# Patient Record
Sex: Female | Born: 1970
Health system: Southern US, Community
[De-identification: ages and names within clinical notes are randomized; demographics above are authoritative.]

## PROBLEM LIST (undated history)

## (undated) DIAGNOSIS — E039 Hypothyroidism, unspecified: Secondary | ICD-10-CM

## (undated) DIAGNOSIS — E785 Hyperlipidemia, unspecified: Secondary | ICD-10-CM

## (undated) DIAGNOSIS — O4290 Premature rupture of membranes, unspecified as to length of time between rupture and onset of labor, unspecified weeks of gestation: Secondary | ICD-10-CM

## (undated) DIAGNOSIS — D649 Anemia, unspecified: Secondary | ICD-10-CM

## (undated) HISTORY — DX: Hyperlipidemia, unspecified: E78.5

## (undated) HISTORY — PX: ESOPHAGOGASTRODUODENOSCOPY: SHX1529

## (undated) HISTORY — DX: Premature rupture of membranes, unspecified as to length of time between rupture and onset of labor, unspecified weeks of gestation: O42.90

## (undated) HISTORY — PX: COLONOSCOPY: SHX174

## (undated) HISTORY — DX: Anemia, unspecified: D64.9

## (undated) HISTORY — PX: DILATION AND CURETTAGE OF UTERUS: SHX78

## (undated) HISTORY — DX: Hypothyroidism, unspecified: E03.9

---

## 1999-01-17 ENCOUNTER — Other Ambulatory Visit: Admission: RE | Admit: 1999-01-17 | Discharge: 1999-01-17 | Payer: Self-pay | Admitting: Gynecology

## 1999-04-09 ENCOUNTER — Inpatient Hospital Stay (HOSPITAL_COMMUNITY): Admission: AD | Admit: 1999-04-09 | Discharge: 1999-04-09 | Payer: Self-pay | Admitting: Gynecology

## 1999-04-18 ENCOUNTER — Inpatient Hospital Stay (HOSPITAL_COMMUNITY): Admission: AD | Admit: 1999-04-18 | Discharge: 1999-04-18 | Payer: Self-pay | Admitting: Gynecology

## 1999-05-03 ENCOUNTER — Encounter: Payer: Self-pay | Admitting: Gynecology

## 1999-05-03 ENCOUNTER — Inpatient Hospital Stay (HOSPITAL_COMMUNITY): Admission: AD | Admit: 1999-05-03 | Discharge: 1999-05-03 | Payer: Self-pay | Admitting: Gynecology

## 2000-01-17 ENCOUNTER — Other Ambulatory Visit: Admission: RE | Admit: 2000-01-17 | Discharge: 2000-01-17 | Payer: Self-pay | Admitting: Gynecology

## 2000-07-20 ENCOUNTER — Emergency Department (HOSPITAL_COMMUNITY): Admission: EM | Admit: 2000-07-20 | Discharge: 2000-07-20 | Payer: Self-pay | Admitting: Emergency Medicine

## 2001-02-02 ENCOUNTER — Emergency Department (HOSPITAL_COMMUNITY): Admission: EM | Admit: 2001-02-02 | Discharge: 2001-02-02 | Payer: Self-pay | Admitting: Emergency Medicine

## 2001-02-12 ENCOUNTER — Other Ambulatory Visit: Admission: RE | Admit: 2001-02-12 | Discharge: 2001-02-12 | Payer: Self-pay | Admitting: Gynecology

## 2001-06-11 ENCOUNTER — Encounter: Payer: Self-pay | Admitting: Emergency Medicine

## 2001-06-11 ENCOUNTER — Emergency Department (HOSPITAL_COMMUNITY): Admission: EM | Admit: 2001-06-11 | Discharge: 2001-06-11 | Payer: Self-pay | Admitting: Emergency Medicine

## 2002-02-15 ENCOUNTER — Other Ambulatory Visit: Admission: RE | Admit: 2002-02-15 | Discharge: 2002-02-15 | Payer: Self-pay | Admitting: Gynecology

## 2003-01-10 ENCOUNTER — Emergency Department (HOSPITAL_COMMUNITY): Admission: EM | Admit: 2003-01-10 | Discharge: 2003-01-10 | Payer: Self-pay | Admitting: Emergency Medicine

## 2003-01-18 ENCOUNTER — Encounter: Admission: RE | Admit: 2003-01-18 | Discharge: 2003-01-18 | Payer: Self-pay | Admitting: Internal Medicine

## 2003-01-18 ENCOUNTER — Encounter: Payer: Self-pay | Admitting: Internal Medicine

## 2003-04-11 ENCOUNTER — Other Ambulatory Visit: Admission: RE | Admit: 2003-04-11 | Discharge: 2003-04-11 | Payer: Self-pay | Admitting: Gynecology

## 2003-12-30 ENCOUNTER — Emergency Department (HOSPITAL_COMMUNITY): Admission: EM | Admit: 2003-12-30 | Discharge: 2003-12-30 | Payer: Self-pay | Admitting: Emergency Medicine

## 2004-01-05 ENCOUNTER — Emergency Department (HOSPITAL_COMMUNITY): Admission: EM | Admit: 2004-01-05 | Discharge: 2004-01-05 | Payer: Self-pay | Admitting: Emergency Medicine

## 2004-05-01 ENCOUNTER — Other Ambulatory Visit: Admission: RE | Admit: 2004-05-01 | Discharge: 2004-05-01 | Payer: Self-pay | Admitting: Gynecology

## 2004-09-04 ENCOUNTER — Inpatient Hospital Stay (HOSPITAL_COMMUNITY): Admission: AD | Admit: 2004-09-04 | Discharge: 2004-09-04 | Payer: Self-pay | Admitting: Gynecology

## 2004-10-07 ENCOUNTER — Inpatient Hospital Stay (HOSPITAL_COMMUNITY): Admission: AD | Admit: 2004-10-07 | Discharge: 2004-10-07 | Payer: Self-pay | Admitting: Gynecology

## 2004-11-21 ENCOUNTER — Inpatient Hospital Stay (HOSPITAL_COMMUNITY): Admission: AD | Admit: 2004-11-21 | Discharge: 2004-11-21 | Payer: Self-pay | Admitting: Gynecology

## 2004-12-01 ENCOUNTER — Inpatient Hospital Stay (HOSPITAL_COMMUNITY): Admission: AD | Admit: 2004-12-01 | Discharge: 2004-12-01 | Payer: Self-pay | Admitting: Gynecology

## 2004-12-11 ENCOUNTER — Inpatient Hospital Stay (HOSPITAL_COMMUNITY): Admission: AD | Admit: 2004-12-11 | Discharge: 2004-12-11 | Payer: Self-pay | Admitting: Gynecology

## 2004-12-24 ENCOUNTER — Inpatient Hospital Stay (HOSPITAL_COMMUNITY): Admission: AD | Admit: 2004-12-24 | Discharge: 2004-12-24 | Payer: Self-pay | Admitting: Gynecology

## 2004-12-25 ENCOUNTER — Inpatient Hospital Stay (HOSPITAL_COMMUNITY): Admission: AD | Admit: 2004-12-25 | Discharge: 2004-12-28 | Payer: Self-pay | Admitting: Gynecology

## 2005-02-26 ENCOUNTER — Other Ambulatory Visit: Admission: RE | Admit: 2005-02-26 | Discharge: 2005-02-26 | Payer: Self-pay | Admitting: Gynecology

## 2005-10-23 ENCOUNTER — Emergency Department (HOSPITAL_COMMUNITY): Admission: EM | Admit: 2005-10-23 | Discharge: 2005-10-24 | Payer: Self-pay | Admitting: Emergency Medicine

## 2005-12-25 ENCOUNTER — Ambulatory Visit (HOSPITAL_BASED_OUTPATIENT_CLINIC_OR_DEPARTMENT_OTHER): Admission: RE | Admit: 2005-12-25 | Discharge: 2005-12-25 | Payer: Self-pay | Admitting: Gynecology

## 2005-12-25 ENCOUNTER — Encounter (INDEPENDENT_AMBULATORY_CARE_PROVIDER_SITE_OTHER): Payer: Self-pay | Admitting: Specialist

## 2005-12-25 HISTORY — PX: OTHER SURGICAL HISTORY: SHX169

## 2006-04-29 ENCOUNTER — Emergency Department (HOSPITAL_COMMUNITY): Admission: EM | Admit: 2006-04-29 | Discharge: 2006-04-29 | Payer: Self-pay | Admitting: Emergency Medicine

## 2006-07-14 ENCOUNTER — Other Ambulatory Visit: Admission: RE | Admit: 2006-07-14 | Discharge: 2006-07-14 | Payer: Self-pay | Admitting: Gynecology

## 2007-01-28 ENCOUNTER — Encounter: Admission: RE | Admit: 2007-01-28 | Discharge: 2007-01-28 | Payer: Self-pay | Admitting: Gastroenterology

## 2007-07-15 ENCOUNTER — Other Ambulatory Visit: Admission: RE | Admit: 2007-07-15 | Discharge: 2007-07-15 | Payer: Self-pay | Admitting: Gynecology

## 2007-12-25 ENCOUNTER — Emergency Department (HOSPITAL_COMMUNITY): Admission: EM | Admit: 2007-12-25 | Discharge: 2007-12-25 | Payer: Self-pay | Admitting: *Deleted

## 2008-05-16 ENCOUNTER — Emergency Department (HOSPITAL_COMMUNITY): Admission: EM | Admit: 2008-05-16 | Discharge: 2008-05-16 | Payer: Self-pay | Admitting: Emergency Medicine

## 2009-01-23 ENCOUNTER — Encounter: Payer: Self-pay | Admitting: Gynecology

## 2009-01-23 ENCOUNTER — Ambulatory Visit: Payer: Self-pay | Admitting: Gynecology

## 2009-01-23 ENCOUNTER — Other Ambulatory Visit: Admission: RE | Admit: 2009-01-23 | Discharge: 2009-01-23 | Payer: Self-pay | Admitting: Gynecology

## 2009-01-31 ENCOUNTER — Ambulatory Visit: Payer: Self-pay | Admitting: Gynecology

## 2009-06-24 ENCOUNTER — Emergency Department (HOSPITAL_COMMUNITY): Admission: EM | Admit: 2009-06-24 | Discharge: 2009-06-24 | Payer: Self-pay | Admitting: Emergency Medicine

## 2009-10-08 IMAGING — US US ABDOMEN COMPLETE
1 series · 14 of 25 positions shown · non-contrast
Comparison: Chest films same day

CLINICAL DATA: Abdominal pain

ABDOMEN ULTRASOUND
TECHNIQUE: Complete abdominal ultrasound examination was performed
including evaluation of the liver, gallbladder, bile ducts,
pancreas, kidneys, spleen, IVC, and abdominal aorta.

[Series 1: unknown · 0.25mm/px · 14 of 49 slices shown]
[im 1/49]
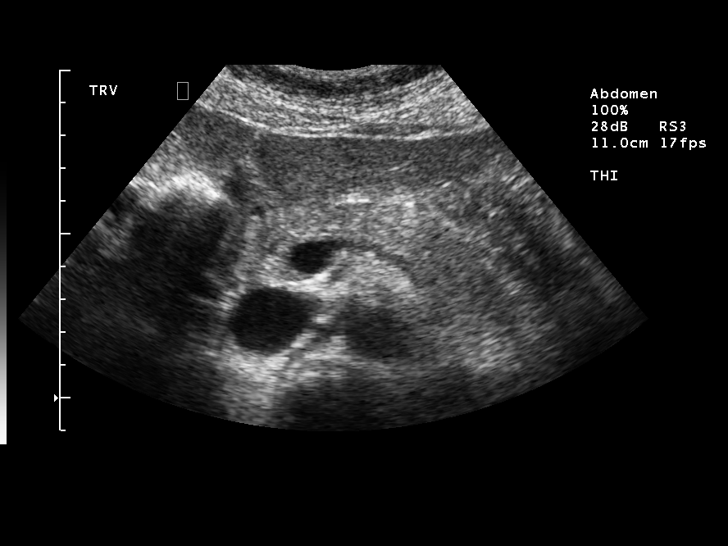
[im 5/49]
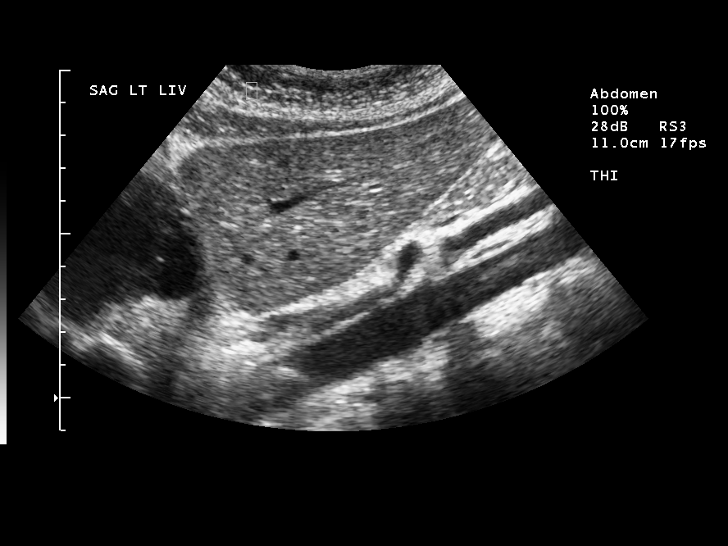
[im 9/49]
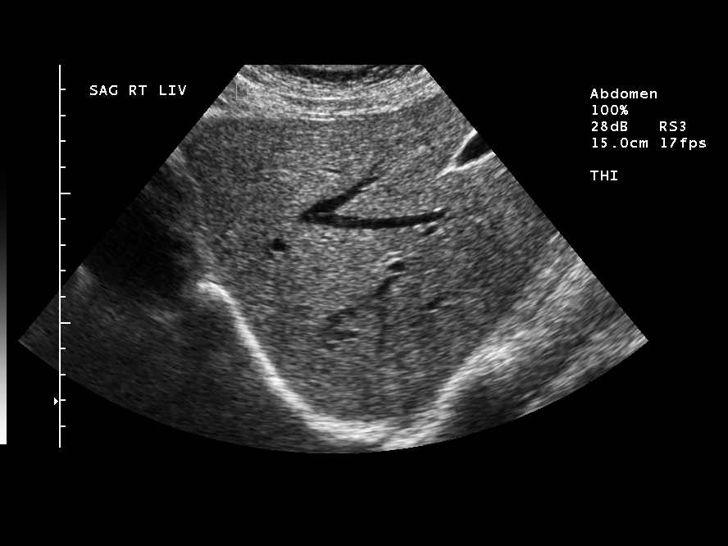
[im 13/49]
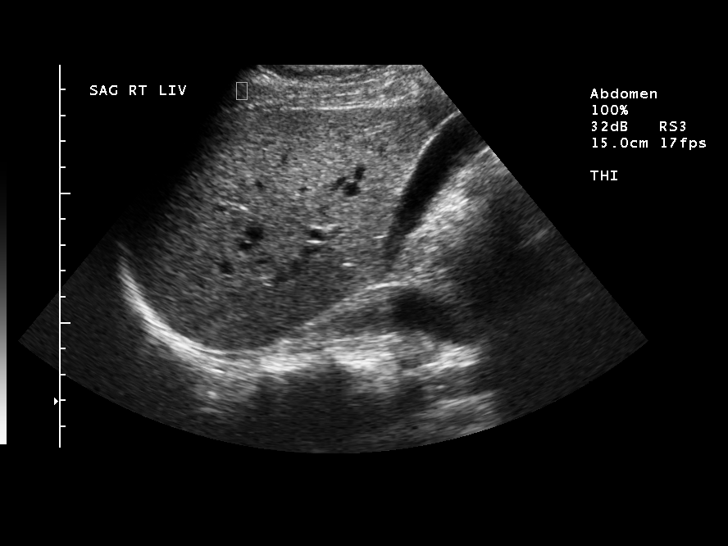
[im 17/49]
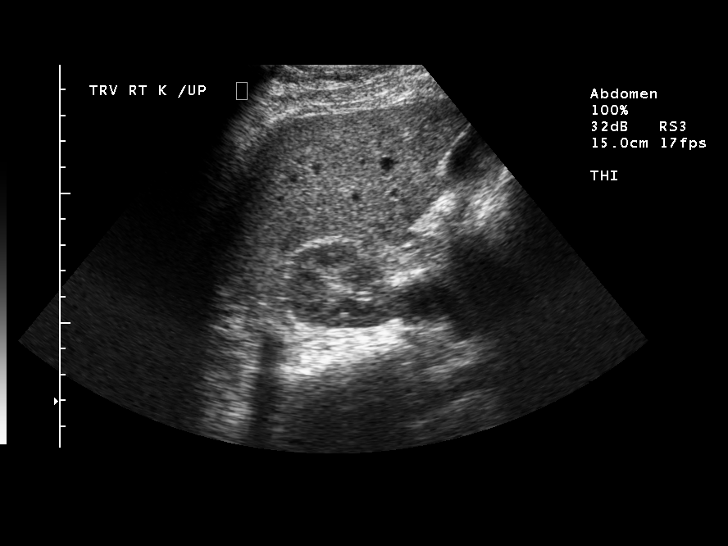
[im 19/49]
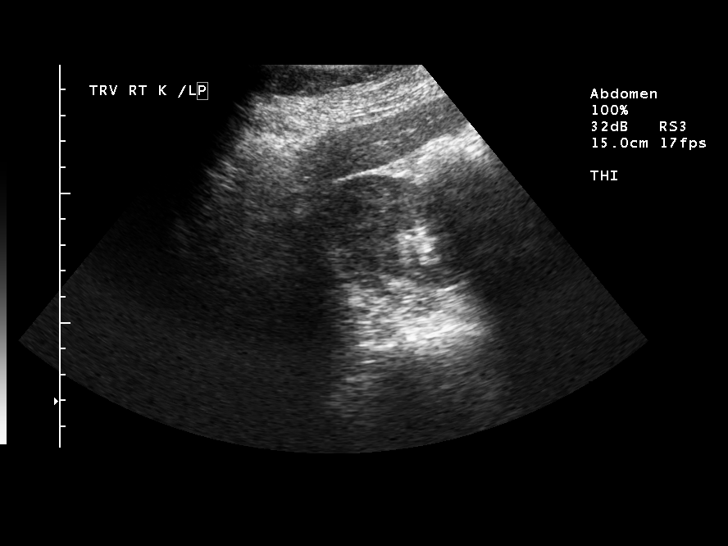
[im 23/49]
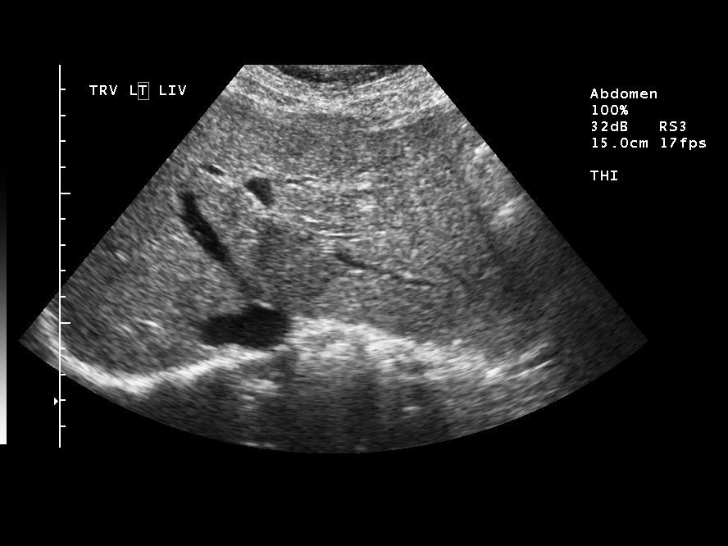
[im 27/49]
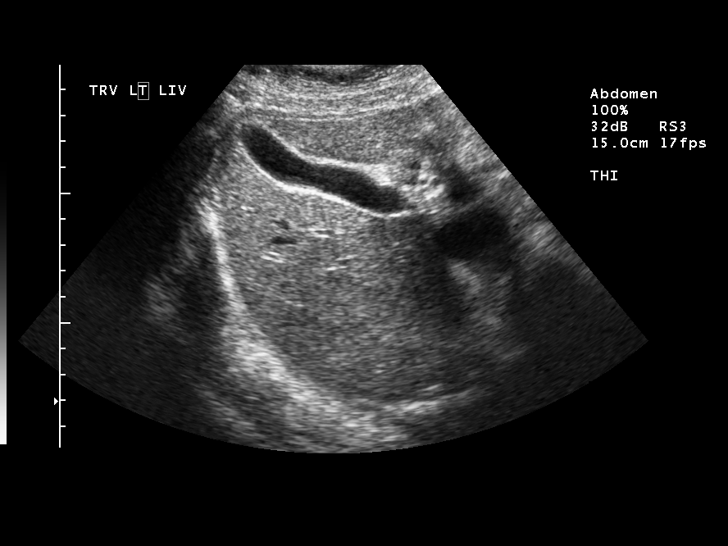
[im 31/49]
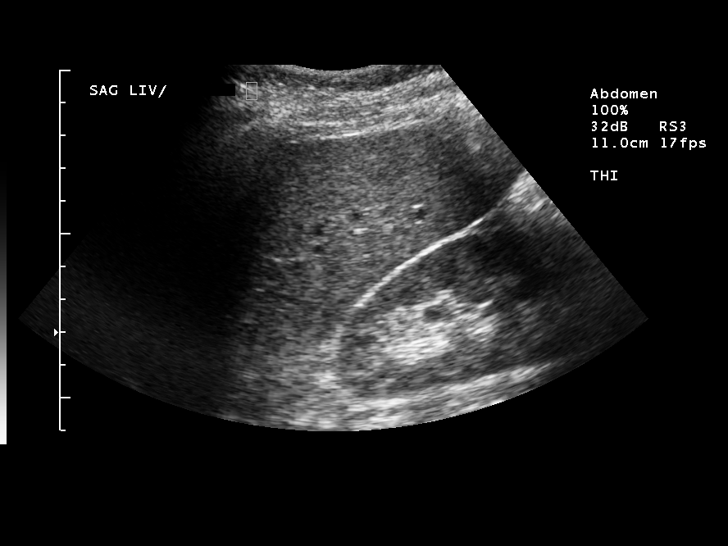
[im 33/49]
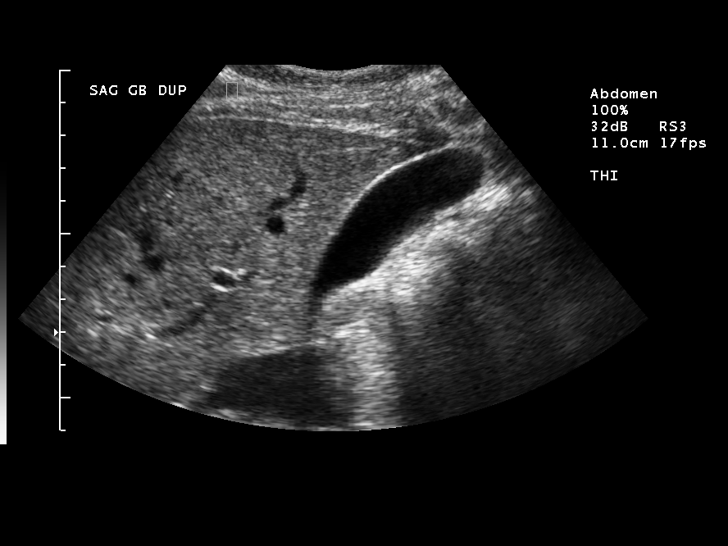
[im 37/49]
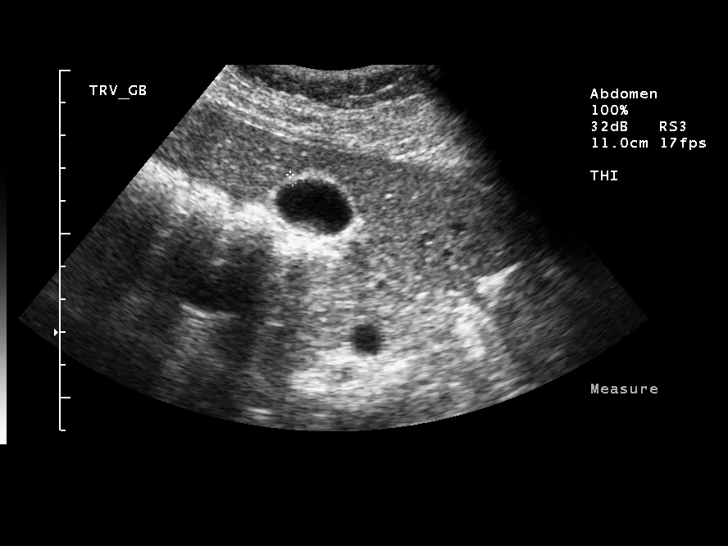
[im 41/49]
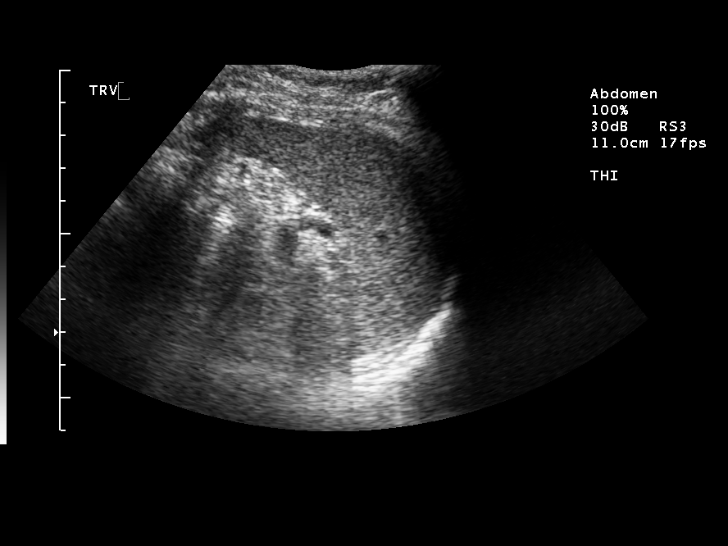
[im 45/49]
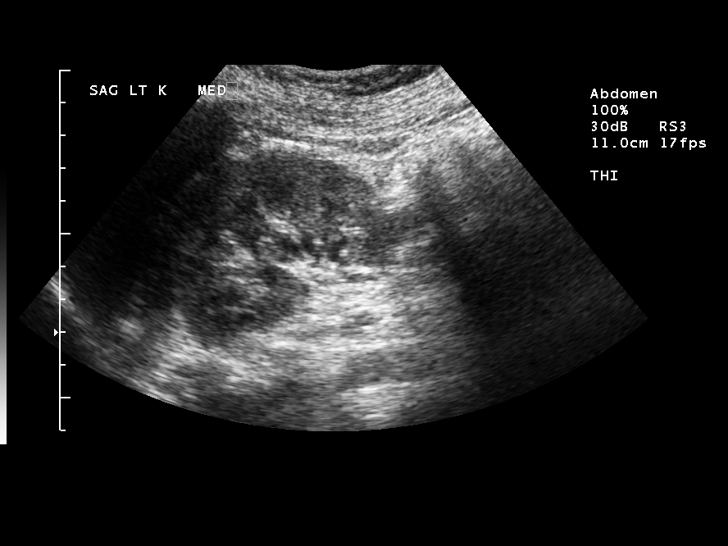
[im 49/49]
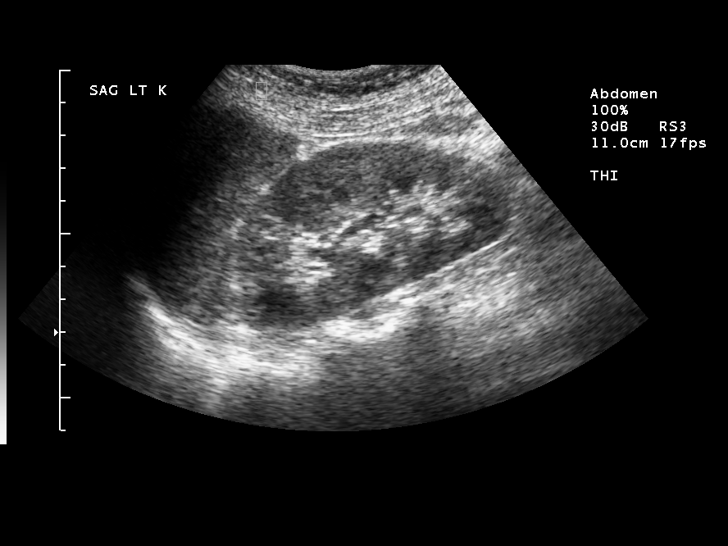

[14 of 25 positions shown; findings below may reference images not displayed]

FINDINGS: The liver is normal in contour and echogenicity.  The
gallbladder is thin-walled at 2.2 mm.  No evidence of
pericholecystic fluid.  No evidence of gallstones.  Negative
sonographic Murphy's sign.  Common bile duct is not visualized

The IVC and pancreas appear normal.

The spleen is normal echogenicity measuring 7.8 cm.

Right kidney measures 8.6 cm.  Left kidney measures 9.1 cm. No
evidence of hydronephrosis.

Abdominal aorta is normal in diameter 1.0 cm.
IMPRESSION: 1..  No evidence of cholecystitis or other abdominal abnormality.

## 2009-10-08 IMAGING — CR DG ABDOMEN ACUTE W/ 1V CHEST
3 series · 3 of 3 positions shown · non-contrast
Comparison: CT abdomen 01/28/2007

CLINICAL DATA: Abdominal pain

ACUTE ABDOMEN SERIES (ABDOMEN 2 VIEW & CHEST 1 VIEW)

[w chest pa]
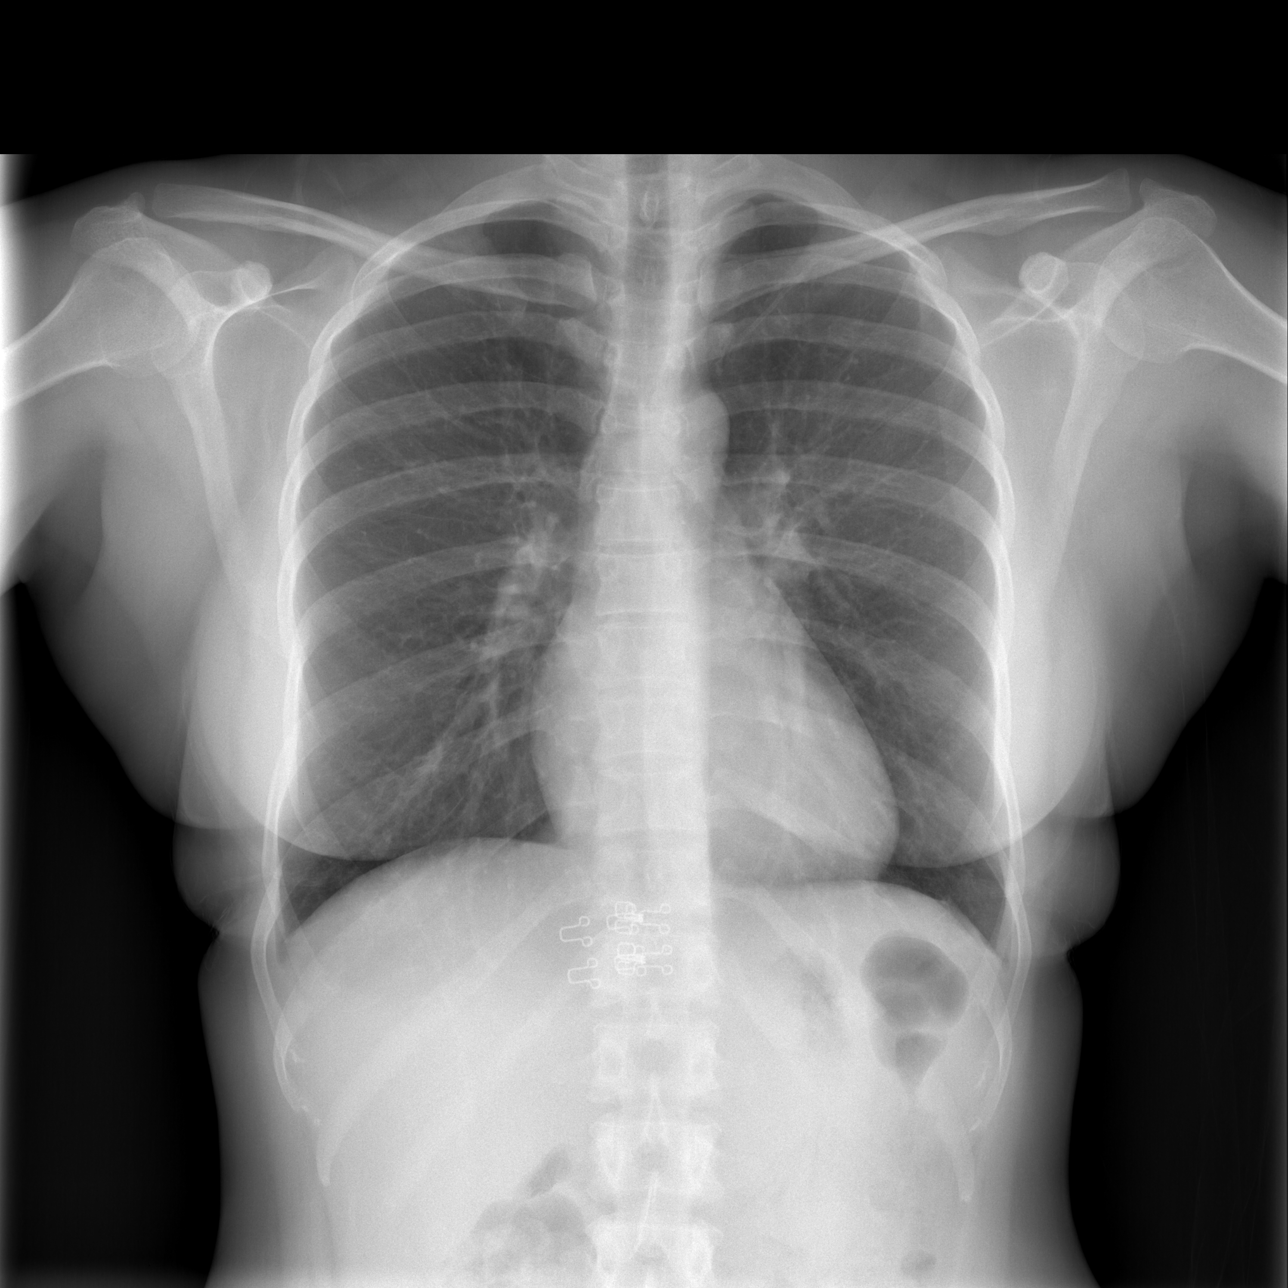

[w abdomen upright *]
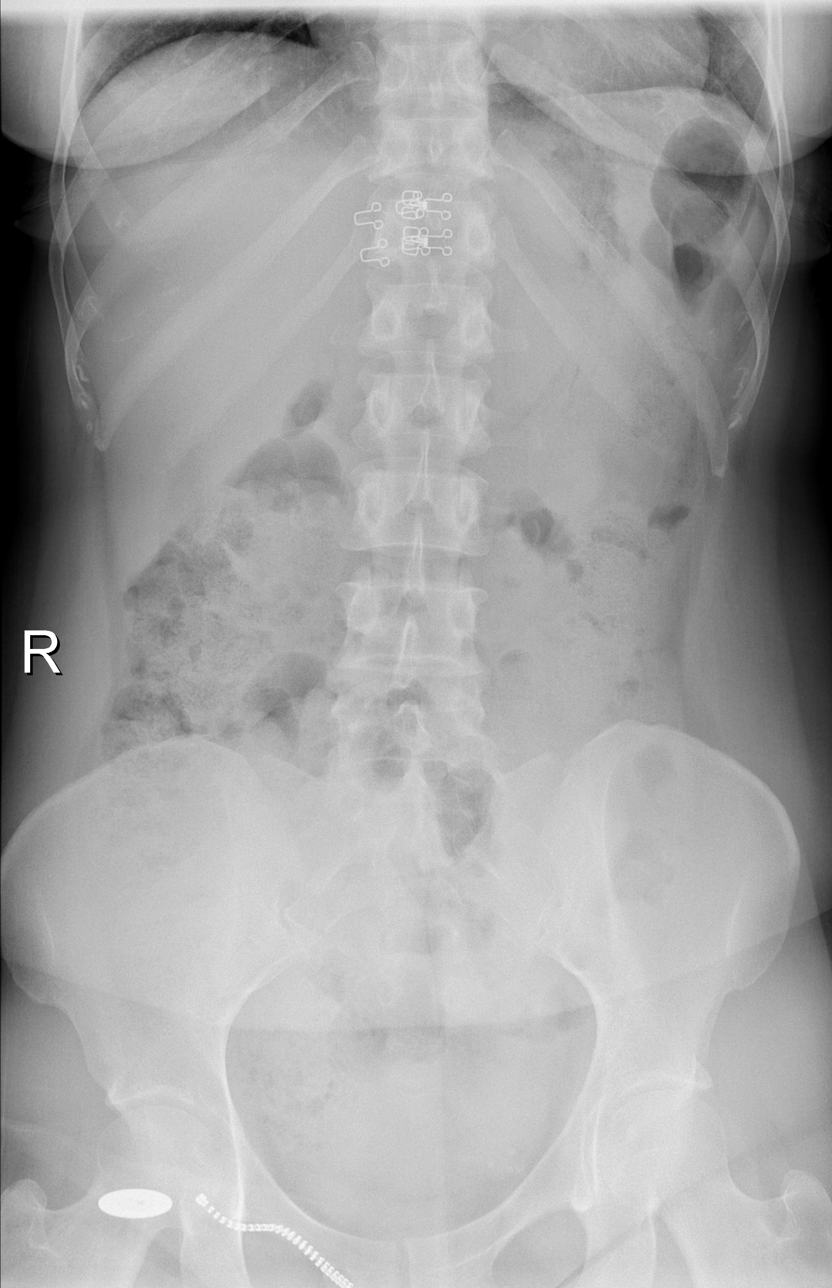

[t abdomen supine]
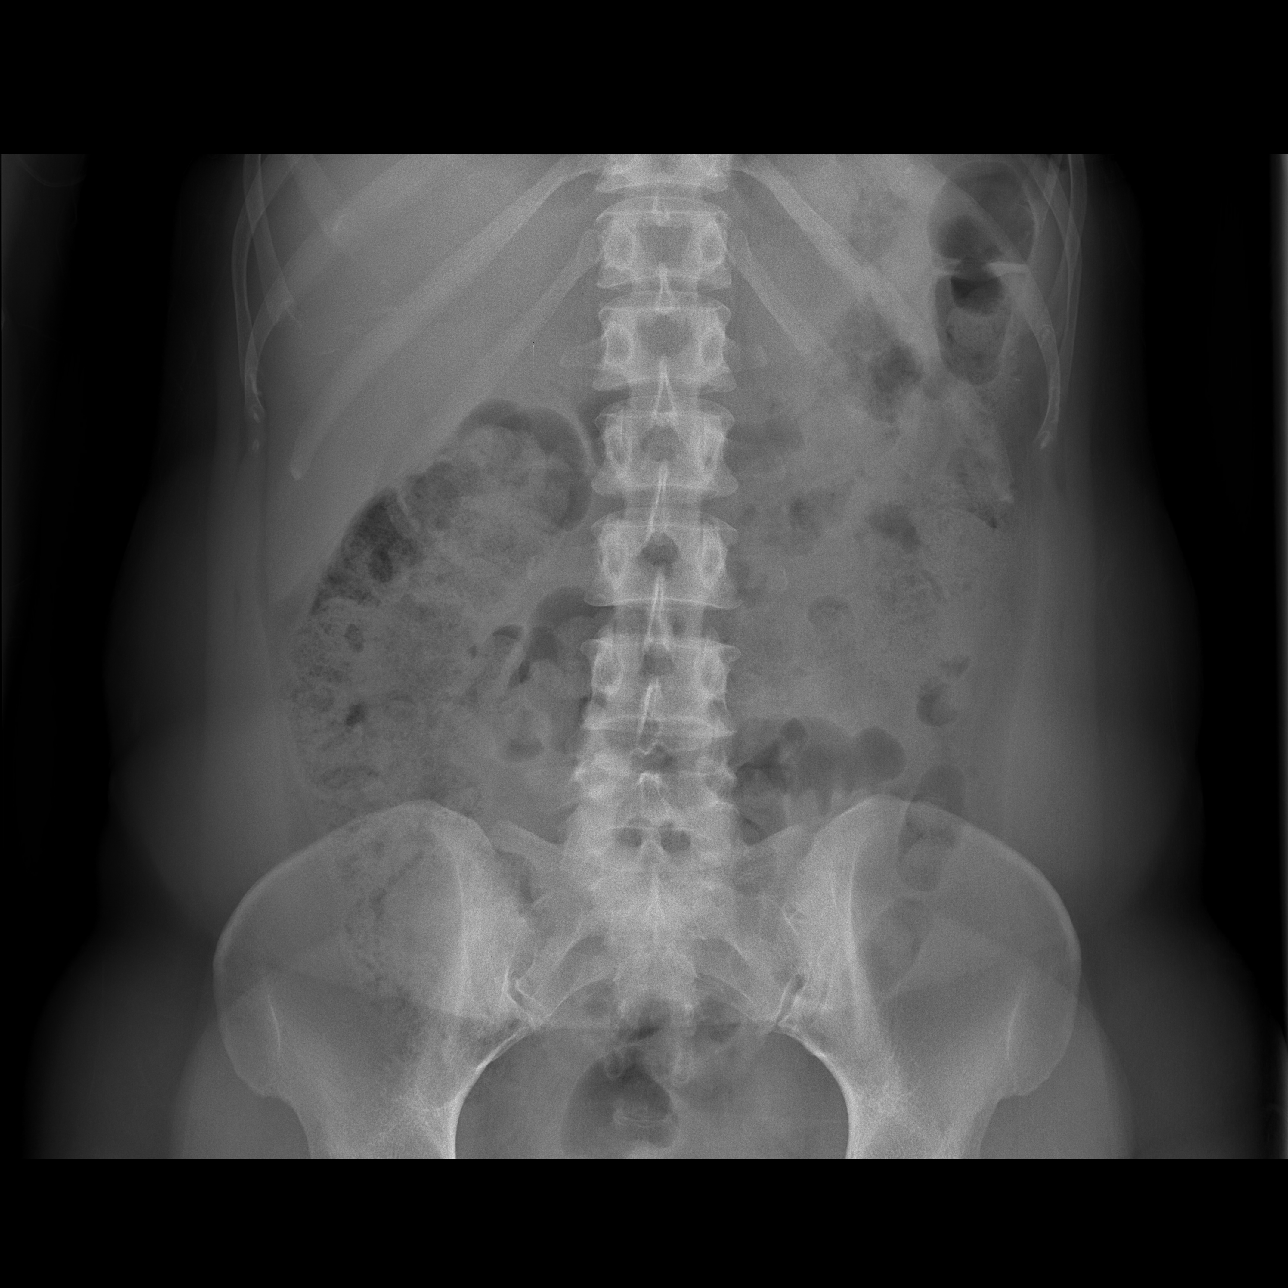

[3 of 3 positions shown; findings below may reference images not displayed]

FINDINGS: Normal mediastinum and cardiac silhouette.  Lungs are
clear.  No free air beneath hemidiaphragms.

No dilated loops of large or small bowel.  Gas and stool present in
the rectum.  No pathologic calcifications.
IMPRESSION: 1.  No acute cardiopulmonary process.
2.  No evidence of free air or bowel obstruction.

## 2010-01-19 ENCOUNTER — Ambulatory Visit: Payer: Self-pay | Admitting: Gynecology

## 2010-01-30 ENCOUNTER — Ambulatory Visit: Payer: Self-pay | Admitting: Gynecology

## 2010-01-30 ENCOUNTER — Other Ambulatory Visit: Admission: RE | Admit: 2010-01-30 | Discharge: 2010-01-30 | Payer: Self-pay | Admitting: Gynecology

## 2010-02-01 ENCOUNTER — Ambulatory Visit: Payer: Self-pay | Admitting: Gynecology

## 2010-03-25 ENCOUNTER — Emergency Department (HOSPITAL_COMMUNITY)
Admission: EM | Admit: 2010-03-25 | Discharge: 2010-03-25 | Payer: Self-pay | Source: Home / Self Care | Admitting: Emergency Medicine

## 2010-05-07 ENCOUNTER — Ambulatory Visit
Admission: RE | Admit: 2010-05-07 | Discharge: 2010-05-07 | Payer: Self-pay | Source: Home / Self Care | Attending: Gynecology | Admitting: Gynecology

## 2010-07-15 LAB — POCT CARDIAC MARKERS
CKMB, poc: <1 ng/mL — ABNORMAL LOW (ref 1.0–8.0)
Myoglobin, poc: 32.2 ng/mL (ref 12–200)
Troponin i, poc: <0.05 ng/mL (ref 0.00–0.09)

## 2010-09-07 NOTE — H&P (Signed)
NAME:  Kimberly Vargas, GENDRON                ACCOUNT NO.:  [id number]   MEDICAL RECORD NO.:  [id number]          PATIENT TYPE:  MAT   LOCATION:  [facility]                          FACILITY:  [facility]   PHYSICIAN:  Juan H. Lily Peer, M.D.DATE OF BIRTH:  [birth year]   DATE OF ADMISSION:  12/25/2004  DATE OF DISCHARGE:                                HISTORY & PHYSICAL   CHIEF COMPLAINT:  Gross rupture of membranes.   HISTORY:  The patient is a 40 year old, gravida 3, para 1, AB1 (one  ectopic), currently 33-6/7th weeks' gestation presented to [facility]  stating that at approximately 1700 hours today she felt a gush of clear  fluid come out of her vagina.  She presented to [facility] and indeed  there was gross rupture of membranes.  It was clear.  Her cervix was  fingertip and short.  She underwent an ultrasound to confirm the  presentation which demonstrated the fetus to be in vertex with a cervical  length measuring 2.3-cm and decreased amniotic fluid with a AFI of 8.2-cm.  The patient was placed on the monitor and she was having infrequent  irregular contractions and her temperature was 98.  Her tracing was overall  reassuring, scattered small variables but overall reassuring fetal heart  rate tracing.   PAST MEDICAL HISTORY:  1.  In 1994, she had a vaginal delivery at term at 65 weeks' gestation.  2.  In 2001, she had an ectopic pregnancy treated with methotrexate.  3.  The patient denies any other medical problems with the exception of the      anemia with this pregnancy which she is on supplemental iron along with      her vitamins.   REVIEW OF SYSTEMS:  See [place] form.   PHYSICAL EXAMINATION:  VITAL SIGNS:  Temperature was 98.5, pulse 85,  respirations 20, blood pressure 121/55.  HEENT:  Unremarkable.  NECK:  Supple.  Trachea midline.  No carotid bruits.  No thyromegaly.  LUNGS:  Clear to auscultation without rhonchi or wheezes.  HEART:  Regular rate and rhythm.  No murmurs or  gallops.  BREASTS:  Not done.  ABDOMEN:  Gravid uterus.  Vertex presentation by [facility] maneuver, confirmed  by ultrasound.  PELVIC:  Gross rupture of membranes clear.  Cervix fingertip and short.  EXTREMITIES:  DTRs 1+.  Negative clonus.   PRENATAL LABS:  O positive blood type.  Negative antibody screen.  VDRL was  nonreactive.  Rubella immune.  Hepatitis B surface antigen and HIV were  negative.  Alpha fetoprotein was normal.  Diabetes screen was normal and Pap  smear was normal.  Also hemoglobin electrophoresis was normal.   ASSESSMENT:  A 40 year old gravida 3, para 1, AB1, at 33-6/7th weeks'  gestation with gross rupture of membranes, afebrile.   PLAN:  1.  The patient will be admitted with anticipation of vaginal delivery      within the next 24 hours but we will try to hold off as much as possible      to get at least two doses of betamethasone to stimulate fetal lung  maturation.  She will receive 12.5 mg of betamethasone now and we will      repeat it again in 12 hours.  2.  She will be started on penicillin G 5 million units IV now, followed by      2.5 million units IV q.4h. for GBS prophylaxis.  3.  We will keep her on continuous monitoring and after the second dose of      betamethasone is on board if she is no longer contracting, we will go      ahead and augment eventually to deliver.   All the above was discussed with the patient.  All questions were answered  and will follow accordingly.      Juan H. Lily Peer, M.D.  Electronically Signed     JHF/MEDQ  D:  12/25/2004  T:  12/25/2004  Job:  [id number]

## 2010-09-07 NOTE — Op Note (Signed)
NAME:  Kimberly Vargas, Kimberly Vargas                ACCOUNT NO.:  [id number]   MEDICAL RECORD NO.:  [id number]          PATIENT TYPE:  AMB   LOCATION:  [facility]                         FACILITY:  [facility]   PHYSICIAN:  Juan H. Lily Peer, M.D.DATE OF BIRTH:  08/21/1970   DATE OF PROCEDURE:  12/25/2005  DATE OF DISCHARGE:                                 OPERATIVE REPORT   SURGEON:  Juan H. Lily Peer, M.D.   INDICATIONS FOR OPERATION:  A 40 year old, gravida 3, para 2, AB 1, with  dysfunctional bleeding.  Workup consisted of sonohysterogram and endometrial  biopsy.  Sonohysterogram demonstrated endometrial polyp.  Endometrial biopsy  proliferate type endometrium and no hyperplasia.   ANESTHESIA:  General endotracheal anesthesia.   PROCEDURES PERFORMED:  1. Resectoscopic polypectomy.  2. D&C.   FINDINGS:  Multiple endometrial polyps were seen.  Both tubal ostia were  seen and clear endocervical canal.   DESCRIPTION OF OPERATION:  After the patient was adequately counseled, she  was taken to the operating room where she underwent successful general  endotracheal anesthesia.  She received a gram of cefoxitin for prophylaxis.  She was placed in low lithotomy position.  The bladder was evacuated of its  contents with red rubber Roxan Hockey for approximately 50 mL.  The uterus, on  bimanual examination, was anteverted, upper limits of normal.  No palpable  mass or tenderness.  A weighted speculum was placed in the posterior vaginal  vault and Sims retractor in the anterior vaginal vault, and the anterior  cervical lip was grasped with a single-tooth tenaculum.  Of note, laminaria  previously placed the night before was removed and facilitated insertion of  the operative resectoscope with a 90-degree wire loop, 2% sorbitol was the  distending media, and __________ [facility] surgical generator  was set at 80 watts on the cutting mode.  Distending pressure was between 90-  100 mmHg, and, in a systematic  fashion, the multiple endometrial polyps were  resected and passed off the operative field, and then vigorous endometrial  curetting was performed as well.  Pre and post procedure pictures were  obtained.  The single-tooth tenaculum was removed.  Fluid deficit was 50 mL.  The patient tolerated procedure well, was transferred to recovery room with  stable vital signs.  She received 30 mg of Toradol IV for postoperative  analgesia.      Juan H. Lily Peer, M.D.  Electronically Signed     JHF/MEDQ  D:  12/25/2005  T:  12/25/2005  Job:  [id number]

## 2010-09-07 NOTE — H&P (Signed)
NAME:  Kimberly Vargas, Kimberly Vargas                ACCOUNT NO.:  [id number]   MEDICAL RECORD NO.:  [id number]          PATIENT TYPE:   LOCATION:                                 FACILITY:   PHYSICIAN:  Juan H. Lily Peer, M.D.     DATE OF BIRTH:   DATE OF ADMISSION:  12/25/2005  DATE OF DISCHARGE:                                HISTORY & PHYSICAL   The patient is scheduled on September 5 at 1 p.m. in [facility].  Please have History and Physical available.   CHIEF COMPLAINT:  Dysfunctional uterine bleeding, endometrial polyp.   HISTORY:  The patient is a 40 year old, gravida 3, para 2, AB 1 who had been  using contraception.  She had been seen on July 16 for dysfunctional uterine  bleeding.  A GC and Chlamydia culture, qualitative HCG, TSH, prolactin have  all been normal.  The patient has continued to have dysfunctional uterine  bleeding.  On pelvic examination, her uterus appeared to be anteverted,  normal size, shape, consistency, adnexa without any palpable mass or  tenderness.  Urine pregnancy test had been negative.  She underwent  endometrial biopsy to rule out any intracavitary lesions, continued to have  dysfunctional uterine bleeding, and she was started on Megace 20 mg b.i.d.  to stop her bleeding, and she returned back on the following week on August  16 for sonohistogram.  The sonohistogram demonstrated endometrial polyp  measuring 12 x 7 mm.  Right and left ovaries were normal.  Her endometrial  biopsy demonstrated proliferative type endometrium with breakdown, and she  presented today to the office for placement of laminaria and conceptual  counseling.   PAST MEDICAL HISTORY:  History of ectopic pregnancy treated by methotrexate.  Next pregnancy resulted in normal spontaneous vaginal delivery, and the  third pregnancy, she delivered at [redacted] weeks gestation as a result of preterm  premature rupture of membranes.  She is using __________ contraception.  Had  been  taking calcium supplementation.  Denies any other surgery.   FAMILY HISTORY:  Grandmother: Diabetes.  Sister with hypertension.  Grandmother: Ovarian cancer, and cardiovascular disease in father.   PHYSICAL EXAMINATION:  VITAL SIGNS: The patient weighs 141 pounds.  HEENT: Unremarkable.  NECK: Supple. Trachea midline. No carotid bruits, no thyromegaly.  LUNGS: Clear to auscultation without rhonchi or wheeze.  HEART: Regular rate and rhythm.  No murmurs or gallops.  BREASTS:  Exam not done.  ABDOMEN: Soft, nontender without rebound or guarding.  PELVIC:  Bartholin's, urethra, Skene, within normal limits.  Vagina and  cervix no lesion or discharge.  Uterus anteverted, normal size, shape, and  consistency.  Adnexa without mass or tenderness.  RECTAL: Exam deferred.   ASSESSMENT:  A 40 year old gravida 3, para 2, AB 1 with dysfunctional  uterine bleeding.  Negative endometrial biopsy.  Last Pap smear November  2006 which had been normal.  Sonohistogram demonstrated intracavitary  endometrial polyp measuring 12 x 7 mm.  TSH, prolactin, GC and chlamydia  cultures all have been negative in the past. She did have a wet prep today  which demonstrated ileitis.  She was given prescription Diflucan 150 mg to  take 1 p.o. today.   In a sterile fashion after the cervix was cleansed with Betadine solution, a  laminaria was placed intracervically to facilitate insertion of the  hysteroscope tomorrow in the operating room.  The risks, benefits, pros and  cons of the operation were discussed with the patient .  Previously  information had been provided to the patient to read.  She will receive  prophylactic antibiotic.  The other risks discussed were uterine  perforation, hemorrhage, emergency laparotomy, corrective surgery of  potential intra-abdominal trauma.  All questions are answered.  Of note, she  had a negative pregnancy test today in the office. All questions were  answered, and we will  follow accordingly.   PLAN:  The patient is scheduled for dissectoscopic polypectomy on Wednesday,  September 5, at 1 p.m. in [facility].  Please have History  and Physical available.  l      Juan H. Lily Peer, M.D.  Electronically Signed     JHF/MEDQ  D:  12/24/2005  T:  12/24/2005  Job:  [id number]

## 2010-11-13 ENCOUNTER — Encounter: Payer: Self-pay | Admitting: Anesthesiology

## 2010-11-16 ENCOUNTER — Ambulatory Visit: Payer: Self-pay | Admitting: Gynecology

## 2010-12-04 ENCOUNTER — Ambulatory Visit (INDEPENDENT_AMBULATORY_CARE_PROVIDER_SITE_OTHER): Payer: 59 | Admitting: Gynecology

## 2010-12-04 ENCOUNTER — Encounter: Payer: Self-pay | Admitting: Gynecology

## 2010-12-04 DIAGNOSIS — R630 Anorexia: Secondary | ICD-10-CM

## 2010-12-04 DIAGNOSIS — R110 Nausea: Secondary | ICD-10-CM

## 2010-12-04 DIAGNOSIS — N83209 Unspecified ovarian cyst, unspecified side: Secondary | ICD-10-CM

## 2010-12-04 LAB — COMPREHENSIVE METABOLIC PANEL
Albumin: 4.4 g/dL (ref 3.5–5.2)
Glucose, Bld: 92 mg/dL (ref 70–99)
Sodium: 136 mEq/L (ref 135–145)
Total Bilirubin: 0.9 mg/dL (ref 0.3–1.2)

## 2010-12-04 MED ORDER — METOCLOPRAMIDE HCL 10 MG PO TABS: 10.0000 mg | ORAL_TABLET | Freq: Three times a day (TID) | ORAL | Status: AC

## 2010-12-04 NOTE — Progress Notes (Signed)
Patient presented to the office today complaining of the past few months of nausea no vomiting. She is not sexually active she did suffer from dysmenorrhea and menorrhagia in the past and had been Jamal 120 oral contraceptive pill and had done well. Due to personal issues and her family she stopped the birth control pill but 2 months ago and has had normal cycles with no severe dysmenorrhea or menorrhagia. She is not sexually active has not been sexually active over a year. She has history of ovarian cyst and was last seen in the office on 05/07/2010 a previous ultrasound in October had demonstrated bilateral ovarian cysts measuring 27 x 22 mm avascular. Due to patient's anxiety and apprehension [place] 125 and had been done and was normal patient was fully aware of its limitations. She return back on January 16 the ultrasound had demonstrated continued presence of a thin-walled cyst with solid internal low level was negative color flow measuring 24 x 21 x 16 mm on the right and left ovary and including follicle was negative color flow. She was asked to return for follow up ultrasound and did not return until today.  Exam: Cardiac auscultation no abnormalities detected. Abdomen: Soft nontender no rebound guarding Pelvic: Bartholin urethra Skene was within normal limits Vagina: No lesion discharge Cervix no lesion or discharge Uterus upper limits of normal no palpable adnexal masses  Assessment: Patient complaining past several months and decreased appetite stable weight nonsexually active over the past year and has history of ovarian cyst which needs follow by ultrasound. We will do a comprehensive metabolic panel along with a CBC and TSH and urine pregnancy tests. She'll return back to the office next week for a pelvic ultrasound and discussion of lab results.

## 2010-12-13 ENCOUNTER — Ambulatory Visit: Payer: 59 | Admitting: Gynecology

## 2010-12-13 ENCOUNTER — Other Ambulatory Visit: Payer: 59

## 2010-12-27 ENCOUNTER — Ambulatory Visit (INDEPENDENT_AMBULATORY_CARE_PROVIDER_SITE_OTHER): Payer: 59 | Admitting: Gynecology

## 2010-12-27 ENCOUNTER — Other Ambulatory Visit: Payer: 59

## 2010-12-27 DIAGNOSIS — R1013 Epigastric pain: Secondary | ICD-10-CM

## 2010-12-27 DIAGNOSIS — N946 Dysmenorrhea, unspecified: Secondary | ICD-10-CM

## 2010-12-27 DIAGNOSIS — N831 Corpus luteum cyst of ovary, unspecified side: Secondary | ICD-10-CM

## 2010-12-27 DIAGNOSIS — N83209 Unspecified ovarian cyst, unspecified side: Secondary | ICD-10-CM

## 2010-12-27 DIAGNOSIS — D251 Intramural leiomyoma of uterus: Secondary | ICD-10-CM

## 2010-12-27 DIAGNOSIS — IMO0001 Reserved for inherently not codable concepts without codable children: Secondary | ICD-10-CM

## 2010-12-27 DIAGNOSIS — D259 Leiomyoma of uterus, unspecified: Secondary | ICD-10-CM

## 2010-12-27 MED ORDER — NORETHIN ACE-ETH ESTRAD-FE 1-20 MG-MCG PO TABS: 1.0000 | ORAL_TABLET | Freq: Every day | ORAL | Status: DC

## 2010-12-27 NOTE — Progress Notes (Signed)
Patient is a 40 year old gravida 3 para 2 AB 1 who for the past several months of been complaining of nausea and vomiting and abdominal discomfort. She's not sexually active does suffer times and dysmenorrhea and menorrhagia had been on the Junel 1/20 oral contraceptive pill but had discontinued. She has had history of ovarian cyst in the past which has resolved spontaneously and the oral contraceptive pills have helped manage all these symptoms. She presented today to the office for a pelvic ultrasound. On last office visit on August 14 we had done a comprehensive metabolic panel, TSH, and CBC all were normal.  On obtaining more history from this patient she has stated that her pains are usually more now centrally located midepigastric that when she start eating she gets nauseated and queasy. She states it doesn't matter the quantity of fluid or the quality of fluid her pain and discomfort the same.  Ultrasound today demonstrated uterus to measure 4.6 x 5.6 x 4.6 cm endometrial stripe 12.9 mm patient on day 12 of her cycle today. Ultrasound demonstrated small intramural myoma measuring 20 x 21 mm her right ovary there was a thick walled cyst with a solid area positive color flow within the mass and periphery measuring 23 x 20 mm suspicious for corpus luteum cyst/hemorrhagic. Left ovary her irregular cystic measured 13 x 17 x 10 mm with a ground glass internal low at the negative color flow.  Assessment: Midepigastric pains associated with meals will need to rule out peptic ulcers disease or cholelithiasis. We'll schedule patient to have an upper abdominal ultrasound and to followup with my GI colleague Dr. Loreta Ave or Dr.Hung for further evaluation. She will restart her oral contraceptive pills on the second day of her upcoming menstrual cycle and will followup in 3 months for her annual gynecological examination and with an ultrasound to see the cysts have resolved. Of note patient denies any hematochezia.

## 2011-01-01 ENCOUNTER — Ambulatory Visit (HOSPITAL_COMMUNITY)
Admission: RE | Admit: 2011-01-01 | Discharge: 2011-01-01 | Disposition: A | Discharge status: Home / Self Care | Payer: 59 | Source: Ambulatory Visit | Attending: Gynecology | Admitting: Gynecology

## 2011-01-01 DIAGNOSIS — R1013 Epigastric pain: Secondary | ICD-10-CM | POA: Insufficient documentation

## 2011-01-01 DIAGNOSIS — R112 Nausea with vomiting, unspecified: Secondary | ICD-10-CM | POA: Insufficient documentation

## 2011-01-02 ENCOUNTER — Telehealth: Payer: Self-pay | Admitting: *Deleted

## 2011-01-02 NOTE — Telephone Encounter (Signed)
Pt informed with the below message.

## 2011-01-02 NOTE — Telephone Encounter (Signed)
Message copied by Aura Camps on Wed Jan 02, 2011 10:55 AM ------      Message from: Ok Edwards      Created: Wed Jan 02, 2011 12:06 AM       Victorino Dike, please inform patient that her upper abdominal ultrasound was normal. Please make sure we have an appointment for her with Dr. Loreta Ave or Dr. Elnoria Howard (gastroenterologist).

## 2011-01-23 LAB — CBC
HCT: 38.7
Hemoglobin: 12.8
MCHC: 33.1
MCV: 94.6
Platelets: 255
RBC: 4.1
RDW: 12.6
WBC: 4.4

## 2011-01-23 LAB — DIFFERENTIAL
Basophils Relative: 0
Eosinophils Relative: 1
Neutro Abs: 3.6
Neutrophils Relative %: 83 — ABNORMAL HIGH

## 2011-01-23 LAB — COMPREHENSIVE METABOLIC PANEL
ALT: 12
AST: 20
Albumin: 3.9
Alkaline Phosphatase: 34 — ABNORMAL LOW
BUN: 9
CO2: 26
Calcium: 9.1
GFR calc Af Amer: >60
GFR calc non Af Amer: >60
Glucose, Bld: 102 — ABNORMAL HIGH
Potassium: 3.9
Sodium: 138
Total Bilirubin: 0.7

## 2011-01-23 LAB — PREGNANCY, URINE: Preg Test, Ur: NEGATIVE

## 2011-01-23 LAB — URINALYSIS, ROUTINE W REFLEX MICROSCOPIC
Ketones, ur: NEGATIVE
Nitrite: NEGATIVE

## 2011-01-23 LAB — LIPASE, BLOOD: Lipase: 27

## 2011-01-24 ENCOUNTER — Other Ambulatory Visit (HOSPITAL_COMMUNITY): Payer: Self-pay | Admitting: Gastroenterology

## 2011-01-24 DIAGNOSIS — R112 Nausea with vomiting, unspecified: Secondary | ICD-10-CM

## 2011-02-15 ENCOUNTER — Encounter (HOSPITAL_COMMUNITY)
Admission: RE | Admit: 2011-02-15 | Discharge: 2011-02-15 | Disposition: A | Discharge status: Home / Self Care | Payer: 59 | Source: Ambulatory Visit | Attending: Gastroenterology | Admitting: Gastroenterology

## 2011-02-15 DIAGNOSIS — R112 Nausea with vomiting, unspecified: Secondary | ICD-10-CM

## 2011-02-15 DIAGNOSIS — R109 Unspecified abdominal pain: Secondary | ICD-10-CM | POA: Insufficient documentation

## 2011-02-15 MED ORDER — SINCALIDE 5 MCG IJ SOLR: 0.0200 ug/kg | Freq: Once | INTRAMUSCULAR | Status: DC

## 2011-02-15 MED ORDER — TECHNETIUM TC 99M MEBROFENIN IV KIT
5.0000 | PACK | Freq: Once | INTRAVENOUS | Status: AC | PRN
  Administered 2011-02-15: 5 via INTRAVENOUS

## 2011-02-22 ENCOUNTER — Encounter: Payer: Self-pay | Admitting: Gynecology

## 2011-07-25 ENCOUNTER — Other Ambulatory Visit: Payer: Self-pay | Admitting: Gastroenterology

## 2011-07-25 DIAGNOSIS — R112 Nausea with vomiting, unspecified: Secondary | ICD-10-CM

## 2011-07-25 DIAGNOSIS — R1013 Epigastric pain: Secondary | ICD-10-CM

## 2011-08-07 ENCOUNTER — Encounter (HOSPITAL_COMMUNITY)
Admission: RE | Admit: 2011-08-07 | Discharge: 2011-08-07 | Disposition: A | Discharge status: Home / Self Care | Payer: 59 | Source: Ambulatory Visit | Attending: Gastroenterology | Admitting: Gastroenterology

## 2011-08-07 DIAGNOSIS — R1013 Epigastric pain: Secondary | ICD-10-CM | POA: Insufficient documentation

## 2011-08-07 DIAGNOSIS — R112 Nausea with vomiting, unspecified: Secondary | ICD-10-CM | POA: Insufficient documentation

## 2011-08-07 MED ORDER — TECHNETIUM TC 99M SULFUR COLLOID: 2.2000 | Freq: Once | INTRAVENOUS | Status: AC | PRN

## 2011-11-22 ENCOUNTER — Encounter: Payer: Self-pay | Admitting: Surgery

## 2011-11-25 ENCOUNTER — Encounter: Payer: Self-pay | Admitting: Surgery

## 2011-11-25 ENCOUNTER — Ambulatory Visit (INDEPENDENT_AMBULATORY_CARE_PROVIDER_SITE_OTHER): Payer: 59 | Admitting: Surgery

## 2011-11-25 ENCOUNTER — Ambulatory Visit (INDEPENDENT_AMBULATORY_CARE_PROVIDER_SITE_OTHER): Payer: 59 | Admitting: *Deleted

## 2011-11-25 VITALS — BP 110/67 | HR 72 | Resp 16 | Ht 64.0 in | Wt 158.0 lb

## 2011-11-25 DIAGNOSIS — M79606 Pain in leg, unspecified: Secondary | ICD-10-CM | POA: Insufficient documentation

## 2011-11-25 DIAGNOSIS — R609 Edema, unspecified: Secondary | ICD-10-CM

## 2011-11-25 DIAGNOSIS — M7989 Other specified soft tissue disorders: Secondary | ICD-10-CM | POA: Insufficient documentation

## 2011-11-25 DIAGNOSIS — M79609 Pain in unspecified limb: Secondary | ICD-10-CM

## 2011-11-25 NOTE — Progress Notes (Signed)
[facility]   Patient name: Kimberly Vargas MRN: [id number] DOB: [birth year] Sex: female   Referred by: Self  Reason for referral:  Chief Complaint  Patient presents with  . Varicose Veins    bil painful vv - pt c/o of numbness and swelling in both legs but mainly in right     HISTORY OF PRESENT ILLNESS: The patient comes in today for evaluation of leg pain and swelling. She states that she has been having problems for many years but that it is getting more severe. She states that she has leg pain and throbbing with prolonged standing and walking. She does have knee-high compression stockings that she does wear during the winter. She is concerned about an area on the medial side of her knee which has become "puffy" and tender.  The patient denies a history of deep vein thrombosis. She denies any history of trauma. She denies ulcerations. She has 2 children  Past Medical History  Diagnosis Date  . Ectopic pregnancy 2001    treated with methotrexate  . NSVD (normal spontaneous vaginal delivery)   . PROM (premature rupture of membranes)     DELIVERY AT 34 WKS  . Anemia     Past Surgical History  Procedure Date  . Resectoscopic polypectomy 12/25/2005  . Dilation and curettage of uterus     History   Social History  . Marital Status: Single    Spouse Name: N/A    Number of Children: N/A  . Years of Education: N/A   Occupational History  . Not on file.   Social History Main Topics  . Smoking status: Never Smoker   . Smokeless tobacco: Never Used  . Alcohol Use: No  . Drug Use: No  . Sexually Active: No   Other Topics Concern  . Not on file   Social History Narrative  . No narrative on file    Family History  Problem Relation Age of Onset  . Hypertension Father   . Heart disease Father     before age 36  . Diabetes Father   . Hyperlipidemia Father   . Heart attack Father   . Hypertension Sister   . Diabetes Paternal Grandmother    . Other Mother     varicose veins    Allergies as of 11/25/2011  . (No Known Allergies)    Current Outpatient Prescriptions on File Prior to Visit  Medication Sig Dispense Refill  . norethindrone-ethinyl estradiol (JUNEL FE,GILDESS FE,LOESTRIN FE) 1-20 MG-MCG tablet Take 1 tablet by mouth daily.  1 Package  11     REVIEW OF SYSTEMS: Positive for pain in legs with walking, swelling or legs, varicose veins, weakness in the legs. All other systems are negative as documented by the patient and the encounter form  PHYSICAL EXAMINATION: General: The patient appears their stated age.  Vital signs are BP 110/67  Pulse 72  Resp 16  Ht 5\' 4"  (1.626 m)  Wt 158 lb (71.668 kg)  BMI 27.12 kg/m2  SpO2 100% HEENT:  No gross abnormalities Pulmonary: Respirations are non-labored Abdomen: Soft and non-tender no masses felt Musculoskeletal: There are no major deformities.   Neurologic: No focal weakness or paresthesias are detected, Skin: There are no ulcer or rashes noted. Psychiatric: The patient has normal affect. Cardiovascular: There is a regular rate and rhythm without significant murmur appreciated. No carotid bruits. Palpable pedal pulses. Reticular vein on the right medial leg.  Diagnostic Studies: Duplex ultrasound was  reviewed and ordered today. This shows reflux in the right greater saphenous and small saphenous vein. Diameter measurements are 0.94 at the saphenofemoral junction on the right. The remaining portion of the right saphenous vein measures greater than 0.3 cm. There is also deep reflux on the right. On the left there is reflux in the proximal greater saphenous vein   Medication Changes: 20-30 mm thigh-high compression stockings were given  Assessment:   bilateral leg swelling, right greater than left Plan: I have discussed with the patient that I feel she has both deep and superficial system reflux. I have recommended thigh-high compression stockings, 20-30 mm  gradient. She will come back to see Dr. Hart [place] in [place] for discussion of laser ablation. I have recommended her starting ibuprofen to help with the pain caused from her prominent vein on the right leg.     Juleen China IV, M.D. [facility] [place] Office: [phone] Pager:  [phone]

## 2012-01-11 ENCOUNTER — Other Ambulatory Visit: Payer: Self-pay | Admitting: Gynecology

## 2012-02-24 ENCOUNTER — Encounter: Payer: Self-pay | Admitting: Vascular Surgery

## 2012-02-25 ENCOUNTER — Ambulatory Visit: Payer: 59 | Admitting: Vascular Surgery

## 2012-02-25 ENCOUNTER — Encounter: Payer: Self-pay | Admitting: Vascular Surgery

## 2012-02-25 ENCOUNTER — Ambulatory Visit (INDEPENDENT_AMBULATORY_CARE_PROVIDER_SITE_OTHER): Payer: 59 | Admitting: Vascular Surgery

## 2012-02-25 VITALS — BP 105/72 | HR 81 | Resp 16 | Ht 64.0 in | Wt 157.0 lb

## 2012-02-25 DIAGNOSIS — I83893 Varicose veins of bilateral lower extremities with other complications: Secondary | ICD-10-CM | POA: Insufficient documentation

## 2012-02-25 NOTE — Progress Notes (Signed)
Subjective:     Patient ID: Kimberly Vargas, female   DOB: December 28, 1970, 41 y.o.   MRN: [id number]  HPI this 41 year old female returns for further discussion regarding possible venous insufficiency of both lower extremities. She was evaluated by Dr. Myra Gianotti 3 months ago. She has been wearing long leg elastic compression stockings 20-30 mm gradient as well as trying elevation and ibuprofen. She has discomfort in the right leg along the medial aspect of the proximal calf and also in the lateral thigh area and does develop edema in both ankles as the day progresses. Her symptoms have been slightly helped by stockings. She has no history of DVT or thrombophlebitis.  Past Medical History  Diagnosis Date  . Ectopic pregnancy 2001    treated with methotrexate  . NSVD (normal spontaneous vaginal delivery)   . PROM (premature rupture of membranes)     DELIVERY AT 34 WKS  . Anemia     History  Substance Use Topics  . Smoking status: Never Smoker   . Smokeless tobacco: Never Used  . Alcohol Use: No    Family History  Problem Relation Age of Onset  . Hypertension Father   . Heart disease Father     before age 68  . Diabetes Father   . Hyperlipidemia Father   . Heart attack Father   . Hypertension Sister   . Diabetes Paternal Grandmother   . Other Mother     varicose veins    No Known Allergies  Current outpatient prescriptions:JUNEL FE 1/20 1-20 MG-MCG tablet, TAKE 1 TABLET BY MOUTH DAILY., Disp: 28 each, Rfl: 1  BP 105/72  Pulse 81  Resp 16  Ht 5\' 4"  (1.626 m)  Wt 157 lb (71.215 kg)  BMI 26.95 kg/m2  Body mass index is 26.95 kg/(m^2).           Review of Systems denies chest pain, dyspnea on exertion, PND, orthopnea, hemoptysis, claudication    Objective:   Physical Exam blood pressure 105/72 heart rate 81 respirations 16 General well-developed well-nourished female in no apparent stress alert and oriented x3 Lungs no rhonchi or wheezing Right lower extremity with 1+  edema. No bulging varicosities are noted. She does have 2 prominent patches of spider veins one in the proximal medial calf were some of her discomfort is located in a few spider veins on the lateral proximal thigh. 3+ dorsalis pedis pulse palpable.  I reviewed her venous duplex exam performed last visit. She does have reflux in her right great saphenous vein but it is a small-caliber vein and I independently performed a SonoSite ultrasound study at the bedside which confirmed the above findings. There is no DVT. There is no significant reflux in the right small saphenous vein. She does have deep vein reflux bilaterally.    Assessment:     Bilateral lower extremity edema right worse than left due to deep vein reflux #2 spider veins right lower extremity    Plan:     #1 short leg elastic compression stockings 20-30 mm gradient #2 sclerotherapy would be the only treatment for the spider veins. If she would like to have this performed we will proceed in the near future or at her request

## 2012-04-02 ENCOUNTER — Encounter: Payer: 59 | Admitting: Gynecology

## 2012-04-09 ENCOUNTER — Encounter: Payer: Self-pay | Admitting: Gynecology

## 2012-04-09 ENCOUNTER — Ambulatory Visit (INDEPENDENT_AMBULATORY_CARE_PROVIDER_SITE_OTHER): Payer: 59 | Admitting: Gynecology

## 2012-04-09 VITALS — BP 114/76 | Ht 63.5 in | Wt 164.0 lb

## 2012-04-09 DIAGNOSIS — N83209 Unspecified ovarian cyst, unspecified side: Secondary | ICD-10-CM

## 2012-04-09 DIAGNOSIS — N915 Oligomenorrhea, unspecified: Secondary | ICD-10-CM

## 2012-04-09 DIAGNOSIS — I83813 Varicose veins of bilateral lower extremities with pain: Secondary | ICD-10-CM

## 2012-04-09 DIAGNOSIS — Z01419 Encounter for gynecological examination (general) (routine) without abnormal findings: Secondary | ICD-10-CM

## 2012-04-09 DIAGNOSIS — N83201 Unspecified ovarian cyst, right side: Secondary | ICD-10-CM

## 2012-04-09 DIAGNOSIS — I83893 Varicose veins of bilateral lower extremities with other complications: Secondary | ICD-10-CM

## 2012-04-09 DIAGNOSIS — R635 Abnormal weight gain: Secondary | ICD-10-CM

## 2012-04-09 LAB — CBC WITH DIFFERENTIAL/PLATELET
Eosinophils Relative: 3 % (ref 0–5)
HCT: 39.8 % (ref 36.0–46.0)
Lymphocytes Relative: 28 % (ref 12–46)
Lymphs Abs: 1.2 10*3/uL (ref 0.7–4.0)
MCV: 93.4 fL (ref 78.0–100.0)
Monocytes Absolute: 0.3 10*3/uL (ref 0.1–1.0)
Neutro Abs: 2.8 10*3/uL (ref 1.7–7.7)
Platelets: 322 10*3/uL (ref 150–400)
RBC: 4.26 MIL/uL (ref 3.87–5.11)
WBC: 4.5 10*3/uL (ref 4.0–10.5)

## 2012-04-09 LAB — COMPREHENSIVE METABOLIC PANEL
Alkaline Phosphatase: 41 U/L (ref 39–117)
BUN: 14 mg/dL (ref 6–23)
Creat: 0.94 mg/dL (ref 0.50–1.10)
Glucose, Bld: 89 mg/dL (ref 70–99)
Total Bilirubin: 0.6 mg/dL (ref 0.3–1.2)

## 2012-04-09 MED ORDER — MEDROXYPROGESTERONE ACETATE 10 MG PO TABS: ORAL_TABLET | ORAL | Status: DC

## 2012-04-09 NOTE — Patient Instructions (Addendum)
Intrauterine Device Information  An intrauterine device (IUD) is inserted into your uterus and prevents pregnancy. There are 2 types of IUDs available:  · Copper IUD. This type of IUD is wrapped in copper wire and is placed inside the uterus. Copper makes the uterus and fallopian tubes produce a fluid that kills sperm. The copper IUD can stay in place for 10 years.  · Hormone IUD. This type of IUD contains the hormone progestin (synthetic progesterone). The hormone thickens the cervical mucus and prevents sperm from entering the uterus, and it also thins the uterine lining to prevent implantation of a fertilized egg. The hormone can weaken or kill the sperm that get into the uterus. The hormone IUD can stay in place for 5 years.  Your caregiver will make sure you are a good candidate for a contraceptive IUD. Discuss with your caregiver the possible side effects.  ADVANTAGES  · It is highly effective, reversible, long-acting, and low maintenance.  · There are no estrogen-related side effects.  · An IUD can be used when breastfeeding.  · It is not associated with weight gain.  · It works immediately after insertion.  · The copper IUD does not interfere with your female hormones.  · The progesterone IUD can make heavy menstrual periods lighter.  · The progesterone IUD can be used for 5 years.  · The copper IUD can be used for 10 years.  DISADVANTAGES  · The progesterone IUD can be associated with irregular bleeding patterns.  · The copper IUD can make your menstrual flow heavier and more painful.  · You may experience cramping and vaginal bleeding after insertion.  Document Released: 03/12/2004 Document Revised: 07/01/2011 Document Reviewed: 08/11/2010  ExitCare® Patient Information ©2013 [employer].

## 2012-04-09 NOTE — Progress Notes (Signed)
Kimberly Vargas 03-May-1970 [id number]   History:    41 y.o.  for annual gyn exam who had recently been evaluated by vascular surgeon in reference to her bilateral lower extremity varicosities. Patient had discontinued the oral contraceptive pills in October and stated she did not have a cycle in November. She states she's not sexually active. This last cycle lasted 7-10 days the early part of December. She has not seen her primary physician over year she has not had any lab work drawn. Her last Pap smear was normal in 2011. Patient will need to schedule her mammogram as well. Patient states she's been doing a lot of exercise recently and perhaps this may of altered her menstrual cycle.  Patient was last seen the office in 2012 she had an ultrasound at that time which demonstrated the following: uterus to measure 4.6 x 5.6 x 4.6 cm endometrial stripe 12.9 mm patient on day 12 of her cycle today. Ultrasound demonstrated small intramural myoma measuring 20 x 21 mm her right ovary there was a thick walled cyst with a solid area positive color flow within the mass and periphery measuring 23 x 20 mm suspicious for corpus luteum cyst/hemorrhagic. Left ovary her irregular cystic measured 13 x 17 x 10 mm with a ground glass internal low at the negative color flow   Past medical history,surgical history, family history and social history were all reviewed and documented in the EPIC chart.  Gynecologic History Patient's last menstrual period was 03/29/2012. Contraception: none Last Pap: 2011. Results were: normal Last mammogram: Several years ago. Results were: Patient states it was normal  Obstetric History OB History    Grav Para Term Preterm Abortions TAB SAB Ect Mult Living   3 2  2 1   1  2      # Outc Date GA Lbr Len/2nd Wgt Sex Del Anes PTL Lv   1 PRE            2 PRE            3 ECT                ROS: A ROS was performed and pertinent positives and negatives are included in the history.   GENERAL: No fevers or chills. HEENT: No change in vision, no earache, sore throat or sinus congestion. NECK: No pain or stiffness. CARDIOVASCULAR: No chest pain or pressure. No palpitations. PULMONARY: No shortness of breath, cough or wheeze. GASTROINTESTINAL: No abdominal pain, nausea, vomiting or diarrhea, melena or bright red blood per rectum. GENITOURINARY: No urinary frequency, urgency, hesitancy or dysuria. MUSCULOSKELETAL: No joint or muscle pain, no back pain, no recent trauma. DERMATOLOGIC: No rash, no itching, no lesions. ENDOCRINE: No polyuria, polydipsia, no heat or cold intolerance. No recent change in weight. HEMATOLOGICAL: No anemia or easy bruising or bleeding. NEUROLOGIC: No headache, seizures, numbness, tingling or weakness. PSYCHIATRIC: No depression, no loss of interest in normal activity or change in sleep pattern.     Exam: chaperone present  BP 114/76  Ht 5' 3.5" (1.613 m)  Wt 164 lb (74.39 kg)  BMI 28.60 kg/m2  LMP 03/29/2012  Body mass index is 28.60 kg/(m^2).  General appearance : Well developed well nourished female. No acute distress HEENT: Neck supple, trachea midline, no carotid bruits, no thyroidmegaly Lungs: Clear to auscultation, no rhonchi or wheezes, or rib retractions  Heart: Regular rate and rhythm, no murmurs or gallops Breast:Examined in sitting and supine position were symmetrical in appearance, no  palpable masses or tenderness,  no skin retraction, no nipple inversion, no nipple discharge, no skin discoloration, no axillary or supraclavicular lymphadenopathy Abdomen: no palpable masses or tenderness, no rebound or guarding Extremities: no edema or skin discoloration or tenderness  Pelvic:  Bartholin, Urethra, Skene Glands: Within normal limits             Vagina: No gross lesions or discharge  Cervix: No gross lesions or discharge  Uterus  anteverted, normal size, shape and consistency, non-tender and mobile  Adnexa  Without masses or  tenderness  Anus and perineum  normal   Rectovaginal  normal sphincter tone without palpated masses or tenderness             Hemoccult not done     Assessment/Plan:  41 y.o. female for annual exam with recent episode of oligomenorrhea. Patient not sexually active. The following labs were were today: TSH, prolactin, comprehensive metabolic panel, screening cholesterol, CBC, and urinalysis. No Pap smear done today new guidelines discussed. Patient was offered the ParaGard T380A IUD or to continue use condoms when she becomes sexually active again. I've given her prescription for Provera 10 mg to take 1 by mouth daily for 5-10 days if she does not have a spontaneous menses every 35 days if the above tests are normal. She will return back to the office in approximately 3 weeks for followup ultrasound on the previously seen right ovarian cyst last year.    Ok Edwards MD, 5:12 PM 04/09/2012

## 2012-04-10 ENCOUNTER — Telehealth: Payer: Self-pay | Admitting: Gynecology

## 2012-04-10 ENCOUNTER — Other Ambulatory Visit: Payer: Self-pay | Admitting: *Deleted

## 2012-04-10 DIAGNOSIS — E7800 Pure hypercholesterolemia, unspecified: Secondary | ICD-10-CM

## 2012-04-10 LAB — URINALYSIS W MICROSCOPIC + REFLEX CULTURE
Bilirubin Urine: NEGATIVE
Crystals: NONE SEEN
Leukocytes, UA: NEGATIVE
Specific Gravity, Urine: 1.026 (ref 1.005–1.030)
Urobilinogen, UA: 1 mg/dL (ref 0.0–1.0)

## 2012-04-10 NOTE — Telephone Encounter (Signed)
Pt was advised today that her [employer] covers the Paraguard & insertion at 100% without copay. Per Rosalita Chessman @[employer]  benefit should extend into January 2014. She will call us when she makes her decision to proceed/WL

## 2012-04-13 ENCOUNTER — Other Ambulatory Visit: Payer: Self-pay | Admitting: *Deleted

## 2012-04-13 LAB — URINE CULTURE

## 2012-04-13 MED ORDER — SULFAMETHOXAZOLE-TRIMETHOPRIM 800-160 MG PO TABS: 1.0000 | ORAL_TABLET | Freq: Two times a day (BID) | ORAL | Status: DC

## 2012-04-20 ENCOUNTER — Encounter (HOSPITAL_COMMUNITY): Payer: Self-pay | Admitting: *Deleted

## 2012-04-20 ENCOUNTER — Emergency Department (HOSPITAL_COMMUNITY): Payer: 59

## 2012-04-20 ENCOUNTER — Observation Stay (HOSPITAL_COMMUNITY): Payer: 59

## 2012-04-20 ENCOUNTER — Observation Stay (HOSPITAL_COMMUNITY)
Admission: EM | Admit: 2012-04-20 | Discharge: 2012-04-20 | Disposition: A | Discharge status: Home / Self Care | Payer: 59 | Attending: Emergency Medicine | Admitting: Emergency Medicine

## 2012-04-20 DIAGNOSIS — R0789 Other chest pain: Principal | ICD-10-CM | POA: Insufficient documentation

## 2012-04-20 LAB — URINALYSIS, ROUTINE W REFLEX MICROSCOPIC
Glucose, UA: NEGATIVE mg/dL
Hgb urine dipstick: NEGATIVE
Ketones, ur: NEGATIVE mg/dL
Leukocytes, UA: NEGATIVE
Protein, ur: NEGATIVE mg/dL
pH: 5 (ref 5.0–8.0)

## 2012-04-20 LAB — COMPREHENSIVE METABOLIC PANEL
Alkaline Phosphatase: 51 U/L (ref 39–117)
BUN: 14 mg/dL (ref 6–23)
CO2: 26 mEq/L (ref 19–32)
Chloride: 100 mEq/L (ref 96–112)
Creatinine, Ser: 1.26 mg/dL — ABNORMAL HIGH (ref 0.50–1.10)
GFR calc Af Amer: 60 mL/min — ABNORMAL LOW (ref >90)
GFR calc non Af Amer: 52 mL/min — ABNORMAL LOW (ref >90)
Glucose, Bld: 109 mg/dL — ABNORMAL HIGH (ref 70–99)
Potassium: 4.7 mEq/L (ref 3.5–5.1)
Total Bilirubin: 0.4 mg/dL (ref 0.3–1.2)

## 2012-04-20 LAB — CBC
HCT: 40.2 % (ref 36.0–46.0)
Hemoglobin: 12.6 g/dL (ref 12.0–15.0)
MCV: 94.8 fL (ref 78.0–100.0)
RBC: 4.24 MIL/uL (ref 3.87–5.11)
RDW: 12.7 % (ref 11.5–15.5)
WBC: 2.5 10*3/uL — ABNORMAL LOW (ref 4.0–10.5)

## 2012-04-20 LAB — POCT I-STAT TROPONIN I: Troponin i, poc: 0 ng/mL (ref 0.00–0.08)

## 2012-04-20 LAB — TROPONIN I: Troponin I: <0.3 ng/mL (ref <0.30)

## 2012-04-20 MED ORDER — NITROGLYCERIN 0.4 MG SL SUBL
0.4000 mg | SUBLINGUAL_TABLET | SUBLINGUAL | Status: DC | PRN
  Administered 2012-04-20: 0.4 mg via SUBLINGUAL

## 2012-04-20 MED ORDER — NITROGLYCERIN 0.4 MG SL SUBL
SUBLINGUAL_TABLET | SUBLINGUAL | Status: AC
  Filled 2012-04-20: qty 25

## 2012-04-20 MED ORDER — IOHEXOL 350 MG/ML SOLN
80.0000 mL | Freq: Once | INTRAVENOUS | Status: AC | PRN
  Administered 2012-04-20: 80 mL via INTRAVENOUS

## 2012-04-20 MED ORDER — METOPROLOL TARTRATE 25 MG PO TABS
100.0000 mg | ORAL_TABLET | Freq: Once | ORAL | Status: DC
  Filled 2012-04-20: qty 2

## 2012-04-20 MED ORDER — KETOROLAC TROMETHAMINE 30 MG/ML IJ SOLN
30.0000 mg | Freq: Once | INTRAMUSCULAR | Status: AC
  Administered 2012-04-20: 30 mg via INTRAVENOUS
  Filled 2012-04-20: qty 1

## 2012-04-20 MED ORDER — METOPROLOL TARTRATE 25 MG PO TABS
50.0000 mg | ORAL_TABLET | Freq: Once | ORAL | Status: AC
  Administered 2012-04-20: 50 mg via ORAL

## 2012-04-20 MED ORDER — SODIUM CHLORIDE 0.9 % IV SOLN
20.0000 mL | INTRAVENOUS | Status: DC
  Administered 2012-04-20: 1000 mL via INTRAVENOUS

## 2012-04-20 MED ORDER — METOPROLOL TARTRATE 1 MG/ML IV SOLN
INTRAVENOUS | Status: AC
  Filled 2012-04-20: qty 20

## 2012-04-20 MED ORDER — IOHEXOL 350 MG/ML SOLN: 50.0000 mL | Freq: Once | INTRAVENOUS | Status: DC | PRN

## 2012-04-20 NOTE — ED Notes (Signed)
Okay to come to CT.

## 2012-04-20 NOTE — ED Provider Notes (Signed)
9:23 AM Assumed care of patient in the CDU from Dr. Manus Gunning.  Patient is currently on Chest Pain Protocol.  The plan is for the patient to have a Coronary CT done today.  Patient presents today with a chief complaint of left anterior chest pain that has been present since yesterday morning.  Pain associated with some numbness of the left arm.  No prior history of Cardiac disease.  No risk factors aside from FH.  No acute changes on EKG.  Initial Troponin negative.  CXR negative.  D-dimer negative.  Remainder of the labs unremarkable.  Reassessed patient.  She denies CP at this time.  Patient is alert and orientated x 3, Heart RRR, Lungs CTAB, Abdomen soft and nontender, No LE edema, DP pulse 2+ bilaterally.  12:50 PM Results of the CT Coronary as follows:  IMPRESSION:  1. No evidence of significant coronary artery atherosclerosis.  The patient's total coronary artery calcium score is 0.  2. There is a trace amount of pericardial fluid and/or thickening,  unlikely to be of hemodynamic significance at this time. No  associated pericardial calcification.  3. There is also a trace amount of pleural fluid and/or thickening  dependently.  4. Right coronary artery dominance.   Discussed results with the patient.  She is not having any chest pain at this time.  Feel that the patient can be discharged home.  Return precautions discussed.  Pascal Lux [place], PA-C 04/20/12 [phone]

## 2012-04-20 NOTE — ED Provider Notes (Signed)
Medical screening examination/treatment/procedure(s) were conducted as a shared visit with non-physician practitioner(s) and myself.  I personally evaluated the patient during the encounter   Glynn Octave, MD 04/20/12 1640

## 2012-04-20 NOTE — ED Notes (Signed)
Patient to CT via wheelchair with RN.

## 2012-04-20 NOTE — ED Provider Notes (Signed)
History     CSN: [id number]  Arrival date & time 04/20/12  7829   First MD Initiated Contact with Patient 04/20/12 [phone]      Chief Complaint  Patient presents with  . Chest Pain    (Consider location/radiation/quality/duration/timing/severity/associated sxs/prior treatment) HPI Comments: Patient presents with left-sided chest pain and arm heaviness since yesterday morning. The pain is constant and came on while she was shopping. Nothing makes it better or worse. She denies any weakness in the arm. She denies any shortness of breath, cough, fever, chills, nausea vomiting. Is not taking any for the pain has never had before. Reports no history of cardiac problems, no hypertension. No diabetes or she is a nonsmoker.  Pain is worse with palpation.  No diaphoresis.  The history is provided by the patient.    Past Medical History  Diagnosis Date  . Ectopic pregnancy 2001    treated with methotrexate  . NSVD (normal spontaneous vaginal delivery)   . PROM (premature rupture of membranes)     DELIVERY AT 34 WKS  . Anemia     Past Surgical History  Procedure Date  . Resectoscopic polypectomy 12/25/2005  . Dilation and curettage of uterus     Family History  Problem Relation Age of Onset  . Hypertension Father   . Heart disease Father     before age 76  . Diabetes Father   . Hyperlipidemia Father   . Heart attack Father   . Hypertension Sister   . Diabetes Paternal Grandmother   . Other Mother     varicose veins  . Cancer Maternal Grandmother     OVARIAN    History  Substance Use Topics  . Smoking status: Never Smoker   . Smokeless tobacco: Never Used  . Alcohol Use: No    OB History    Grav Para Term Preterm Abortions TAB SAB Ect Mult Living   3 2  2 1   1  2       Review of Systems  Constitutional: Negative for fever, activity change and appetite change.  HENT: Negative for congestion and rhinorrhea.   Respiratory: Positive for chest tightness. Negative for  cough and shortness of breath.   Cardiovascular: Positive for chest pain.  Gastrointestinal: Negative for nausea, vomiting and abdominal pain.  Genitourinary: Negative for dysuria and hematuria.  Musculoskeletal: Negative for back pain.  Skin: Negative for rash.  Neurological: Negative for dizziness and headaches.  A complete 10 system review of systems was obtained and all systems are negative except as noted in the HPI and PMH.    Allergies  Review of patient's allergies indicates no known allergies.  Home Medications   No current outpatient prescriptions on file.  BP 113/70  Pulse 72  Temp 98.6 F (37 C) (Oral)  Resp 16  Ht 5\' 4"  (1.626 m)  Wt 158 lb (71.668 kg)  BMI 27.12 kg/m2  SpO2 98%  LMP 03/29/2012  Physical Exam  Constitutional: She is oriented to person, place, and time. She appears well-developed and well-nourished. No distress.  HENT:  Head: Normocephalic and atraumatic.  Mouth/Throat: Oropharynx is clear and moist. No oropharyngeal exudate.  Eyes: Conjunctivae normal and EOM are normal. Pupils are equal, round, and reactive to light.  Neck: Normal range of motion. Neck supple.  Cardiovascular: Normal rate, regular rhythm and normal heart sounds.   No murmur heard. Pulmonary/Chest: Effort normal and breath sounds normal. No respiratory distress. She exhibits tenderness.  TTP L chest wall, no rash  Abdominal: Soft. There is no tenderness. There is no rebound and no guarding.  Musculoskeletal: Normal range of motion. She exhibits no edema and no tenderness.  Neurological: She is alert and oriented to person, place, and time. No cranial nerve deficit. She exhibits normal muscle tone. Coordination normal.  Skin: Skin is warm.    ED Course  Procedures (including critical care time)  Labs Reviewed  CBC - Abnormal; Notable for the following:    WBC 2.5 (*)     All other components within normal limits  COMPREHENSIVE METABOLIC PANEL - Abnormal; Notable  for the following:    Glucose, Bld 109 (*)     Creatinine, Ser 1.26 (*)     GFR calc non Af Amer 52 (*)     GFR calc Af Amer 60 (*)     All other components within normal limits  TROPONIN I  POCT I-STAT TROPONIN I  D-DIMER, QUANTITATIVE  PREGNANCY, URINE  URINALYSIS, ROUTINE W REFLEX MICROSCOPIC  TROPONIN I  TROPONIN I  TROPONIN I   Dg Chest 2 View  04/20/2012  *RADIOLOGY REPORT*  Clinical Data: Chest pain and left arm numbness.  CHEST - 2 VIEW  Comparison: Chest radiograph performed 06/24/2009  Findings: The lungs are well-aerated and clear.  There is no evidence of focal opacification, pleural effusion or pneumothorax.  The heart is normal in size; the mediastinal contour is within normal limits.  No acute osseous abnormalities are seen.  IMPRESSION: No acute cardiopulmonary process seen.   Original Report Authenticated By: Tonia Ghent, M.D.      1. Atypical chest pain       MDM  L sided chest pain that is reproducible. Associated with L arm numbness, and "heaviness".  Vitals stable, no distress.  CXR negative. EKG nonischemic.  Patient's pain is atypical and she has minimal risk factors, though a family history of early MI.  She is agreeable to stress test today in the CDU.   Date: 04/20/2012  Rate: 68  Rhythm: normal sinus rhythm  QRS Axis: normal  Intervals: normal  ST/T Wave abnormalities: normal  Conduction Disutrbances:none  Narrative Interpretation:   Old EKG Reviewed: none available    Glynn Octave, MD 04/20/12 [phone]

## 2012-04-20 NOTE — ED Notes (Signed)
Returned from CT via wheelchair with RN. 

## 2012-04-20 NOTE — ED Notes (Addendum)
Pt c/o L chest pain and L arm numbness.  States fam hx of early MI's.  Felt "tired" on Sat and on Sun L chest pain and L arm numbness began.  Pt stated her vascular MD stated last month that she has "leaky internal veins" bil LE.

## 2012-04-21 NOTE — Progress Notes (Signed)
UR completed for CDU observation pt---secondary review- meeting observation   

## 2012-04-23 ENCOUNTER — Emergency Department (HOSPITAL_COMMUNITY): Payer: 59

## 2012-04-23 ENCOUNTER — Emergency Department (HOSPITAL_COMMUNITY)
Admission: EM | Admit: 2012-04-23 | Discharge: 2012-04-23 | Disposition: A | Discharge status: Home / Self Care | Payer: 59 | Attending: Emergency Medicine | Admitting: Emergency Medicine

## 2012-04-23 ENCOUNTER — Encounter (HOSPITAL_COMMUNITY): Payer: Self-pay

## 2012-04-23 DIAGNOSIS — R0789 Other chest pain: Secondary | ICD-10-CM | POA: Diagnosis present

## 2012-04-23 DIAGNOSIS — I319 Disease of pericardium, unspecified: Secondary | ICD-10-CM | POA: Insufficient documentation

## 2012-04-23 DIAGNOSIS — Z862 Personal history of diseases of the blood and blood-forming organs and certain disorders involving the immune mechanism: Secondary | ICD-10-CM | POA: Insufficient documentation

## 2012-04-23 LAB — POCT I-STAT, CHEM 8
BUN: 11 mg/dL (ref 6–23)
Calcium, Ion: 1.28 mmol/L — ABNORMAL HIGH (ref 1.12–1.23)
Chloride: 107 mEq/L (ref 96–112)
Creatinine, Ser: 1.1 mg/dL (ref 0.50–1.10)
Glucose, Bld: 92 mg/dL (ref 70–99)

## 2012-04-23 LAB — POCT I-STAT TROPONIN I: Troponin i, poc: 0 ng/mL (ref 0.00–0.08)

## 2012-04-23 NOTE — ED Notes (Signed)
Family at bedside. 

## 2012-04-23 NOTE — Consult Note (Signed)
Admit date: 04/23/2012 Referring Physician  [facility]: Emergency Room Physician, Dr. Radford Pax Primary Physician  unknown Primary Cardiologist  Gwynneth Albright, M.D. Reason for Consultation  chest pain  ASSESSMENT: 1. Possible pericarditis, a presumed diagnosis made here in the emergency room on 04/20/2012 when she presented with chest discomfort and left arm radiation and CT scan showed a small pericardial effusion and a small right pleural effusion. No specific diagnosis could be made as there was no mention of pericardial rub and the EKG did not reveal typical ST elevation pattern of pericarditis. She came back to the emergency room today because of mild nausea, and chest squeezing. She has been taking ibuprofen 200 mg with meals. After reviewing the data, I do believe that the clinical diagnosis pericarditis is tenable.  2. History of esophageal reflux.  PLAN:  1. Continue ibuprofen therapy but increase the dose to 400 mg 3 times per day with meals. Instructed to stop the medication immediately if abdominal discomfort or side effects.  2. Clinical observation and if she continues to have discomfort consider 2-D echocardiography. This is not felt to be needed at this time given recent CT angiogram of the   HPI: Kimberly Vargas is 42 years of age and has good health with the exception of esophageal reflux and gastritis. 5 days ago, Saturday she did not feel well. On Sunday she noted a grabbing feeling in her chest and left arm discomfort. She came to the emergency room where a workup was performed including markers, EKG, and a CT angiogram of the heart and lungs. The workup was unremarkable with the exception of a small pericardial effusion and a right pleural effusion. No evidence of pneumonia was noted. She did not have chills or fever. She was instructed to use ibuprofen. She was taking only 200 mg 3 times per day with food. This morning as she awakened to go to work she again had a recurrence of  the squeezing sensation in her chest with some left arm discomfort. She came back to the emergency room concerned that perhaps something else was going on.   PMH:   Past Medical History  Diagnosis Date  . Ectopic pregnancy 2001    treated with methotrexate  . NSVD (normal spontaneous vaginal delivery)   . PROM (premature rupture of membranes)     DELIVERY AT 34 WKS  . Anemia      PSH:   Past Surgical History  Procedure Date  . Resectoscopic polypectomy 12/25/2005  . Dilation and curettage of uterus     Allergies:  Review of patient's allergies indicates no known allergies. Prior to Admit Meds:   (Not in a hospital admission) Fam HX:    Family History  Problem Relation Age of Onset  . Hypertension Father   . Heart disease Father     before age 8  . Diabetes Father   . Hyperlipidemia Father   . Heart attack Father   . Hypertension Sister   . Diabetes Paternal Grandmother   . Other Mother     varicose veins  . Cancer Maternal Grandmother     OVARIAN   Social HX:    History   Social History  . Marital Status: Single    Spouse Name: N/A    Number of Children: N/A  . Years of Education: N/A   Occupational History  . Not on file.   Social History Main Topics  . Smoking status: Never Smoker   . Smokeless tobacco:  Never Used  . Alcohol Use: No  . Drug Use: No  . Sexually Active: No   Other Topics Concern  . Not on file   Social History Narrative  . No narrative on file     Review of Systems: No specific complaints other than as noted above  Physical Exam: Blood pressure 102/60, pulse 65, temperature 98.5 F (36.9 C), temperature source Oral, resp. rate 11, last menstrual period 04/08/2012, SpO2 98.00%. Weight change:   She is lying comfortably on the hospital gurney. She is in no distress. She is having no discomfort. Skin color and texture is normal.  HEENT exam unremarkable  Neck exam reveals no JVD or carotid bruits  Cardiac exam is unremarkable.  No rub, murmur, click, or gallop is heard.  Lungs are clear auscultation and percussion. No rub is heard.  Abdomen is soft without tenderness.  Extremities reveal no edema. The radial and posterior tibial pulses are 2+ and symmetric.  The neurological exam is unremarkable to Labs:   Lab Results  Component Value Date   WBC 2.5* 04/20/2012   HGB 13.6 04/23/2012   HCT 40.0 04/23/2012   MCV 94.8 04/20/2012   PLT 256 04/20/2012    Lab 04/23/12 1133 04/20/12 0640  NA 140 --  K 4.7 --  CL 107 --  CO2 -- 26  BUN 11 --  CREATININE 1.10 --  CALCIUM -- 9.6  PROT -- 7.2  BILITOT -- 0.4  ALKPHOS -- 51  ALT -- 19  AST -- 24  GLUCOSE 92 --    Lab Results  Component Value Date   TROPONINI <0.30 04/20/2012     Lab Results  Component Value Date   CHOL 214* 04/09/2012    Radiology:  Dg Chest 2 View  04/23/2012  *RADIOLOGY REPORT*  Clinical Data: Chest pain  CHEST - 2 VIEW  Comparison: Coronary CTA 04/20/2012; chest x-ray also 04/20/2012  Findings: The lungs are well-aerated and free from pulmonary edema, focal airspace consolidation or pulmonary nodule.  Cardiac and mediastinal contours are within normal limits.  No pneumothorax, or pleural effusion. No acute osseous findings.  IMPRESSION:  No acute cardiopulmonary disease.   Original Report Authenticated By: Malachy Moan, M.D.    CTA of Heart: 04/20/2012 *RADIOLOGY REPORT*  INDICATION: 42 year old female with history of chest pain.  CT ANGIOGRAPHY OF THE HEART, CORONARY ARTERY, STRUCTURE, AND  MORPHOLOGY  COMPARISON: No priors.  CONTRAST: 80 ml of Omnipaque 350.  TECHNIQUE: CT angiography of the coronary vessels was performed on  a 256 channel system using prospective ECG gating. A scout and ECG-  gated noncontrast exam (for calcium scoring) were performed.  Appropriate delay was determined by bolus tracking after injection  of iodinated contrast, and an ECG-gated coronary CTA was performed  with sub-mm slice collimation  during late diastole. Imaging post  processing was performed on an independent workstation creating  multiplanar and 3-D images, allowing for quantitative analysis of  the heart and coronary arteries. Note that this exam targets the  heart and the chest was not imaged in its entirety.  PREMEDICATION:  Lopressor 100 mg, P.O.  Nitroglycerin 400 mcg, sublingual.  FINDINGS:  Technical quality: Excellent.  Heart rate: 53-56.  CORONARY ARTERIES:  Left Main: Negative for atherosclerosis.  LAD: Negative for atherosclerosis.  Diagonals: Small caliber D1 & D3, large D2. Negative  for atherosclerosis.  LCx: Negative for atherosclerosis.  OMs: Small OM1, large OM2 & OM3. Negative for  atherosclerosis.  RCA: Negative for atherosclerosis.  PDA: Negative for atherosclerosis.  Dominance: Right.  CORONARY CALCIUM:  Total Agatston Score: 0  MESA database percentile: N/A  AORTA AND PULMONARY MEASUREMENTS:  Aortic root (21 - 40 mm): 22 mm at the annulus  29 mm at the sinuses of Valsalva  23 mm at the sinotubular junction  Ascending aorta: (< 40 mm): 23 mm  Descending aorta: (< 40 mm): 19 mm  Main pulmonary artery: (< 30 mm): 21 mm  OTHER FINDINGS:  Trace amount of anterior pericardial fluid and/or thickening,  unlikely to be of hemodynamic significance at this time. No  associated pericardial calcification. Small calcified granuloma in  the right middle lobe. Trace amount of pleural fluid and/or  thickening dependently. The within the visualized thorax there is  no consolidative airspace disease or suspicious appearing pulmonary  nodules. Visualized portions of the upper abdomen are  unremarkable. There are no aggressive appearing lytic or blastic  lesions noted in the visualized portions of the skeleton.  IMPRESSION:  1. No evidence of significant coronary artery atherosclerosis.  The patient's total coronary artery calcium score is 0.  2. There is a trace amount of pericardial fluid and/or  thickening,  unlikely to be of hemodynamic significance at this time. No  associated pericardial calcification.  3. There is also a trace amount of pleural fluid and/or thickening  dependently.  4. Right coronary artery dominance.  Report was called to CDU mid level at [phone] at 12:15 p.m. on  04/20/2012.  Original Report Authenticated By: Trudie Reed, M.D  EKG:  Normal 04/23/2012    Kimberly Vargas W 04/23/2012 1:56 PM

## 2012-04-23 NOTE — ED Notes (Addendum)
Pt presents with continued L sided chest pain since being seen for same on Monday.  Pt reports pain is constant and radiates into L scapula.  +shortness of breath especially with exertion.  Pt reports she was told she has "fluid around my heart" and was told to take ibuprofen for pain.  Pt reports today she became dizzy with near syncope reported.  Pt denies any recent illness, reports symptoms have worsened.

## 2012-04-23 NOTE — ED Provider Notes (Signed)
History     CSN: [id number]  Arrival date & time 04/23/12  0936   First MD Initiated Contact with Patient 04/23/12 [phone]      No chief complaint on file.    HPI Pt presents with continued L sided chest pain since being seen for same on Monday. Pt reports pain is constant and radiates into L scapula. +shortness of breath especially with exertion. Pt reports she was told she has "fluid around my heart" and was told to take ibuprofen for pain. Pt reports today she became dizzy with near syncope reported. Pt denies any recent illness, reports symptoms have worsened.  Past Medical History  Diagnosis Date  . Ectopic pregnancy 2001    treated with methotrexate  . NSVD (normal spontaneous vaginal delivery)   . PROM (premature rupture of membranes)     DELIVERY AT 34 WKS  . Anemia     Past Surgical History  Procedure Date  . Resectoscopic polypectomy 12/25/2005  . Dilation and curettage of uterus     Family History  Problem Relation Age of Onset  . Hypertension Father   . Heart disease Father     before age 42  . Diabetes Father   . Hyperlipidemia Father   . Heart attack Father   . Hypertension Sister   . Diabetes Paternal Grandmother   . Other Mother     varicose veins  . Cancer Maternal Grandmother     OVARIAN    History  Substance Use Topics  . Smoking status: Never Smoker   . Smokeless tobacco: Never Used  . Alcohol Use: No    OB History    Grav Para Term Preterm Abortions TAB SAB Ect Mult Living   3 2  2 1   1  2       Review of Systems All other systems reviewed and are negative Allergies  Review of patient's allergies indicates no known allergies.  Home Medications   Current Outpatient Rx  Name  Route  Sig  Dispense  Refill  . IBUPROFEN 200 MG PO TABS   Oral   Take 600 mg by mouth every 6 (six) hours as needed. For pain           BP 117/64  Pulse 65  Temp 98.5 F (36.9 C) (Oral)  Resp 15  SpO2 98%  LMP 04/08/2012  Physical Exam  Nursing  note and vitals reviewed. Constitutional: She is oriented to person, place, and time. She appears well-developed and well-nourished. No distress.  HENT:  Head: Normocephalic and atraumatic.  Eyes: Pupils are equal, round, and reactive to light.  Neck: Normal range of motion.  Cardiovascular: Normal rate and intact distal pulses.   Pulmonary/Chest: No respiratory distress.  Abdominal: Normal appearance. She exhibits no distension. There is no tenderness.  Musculoskeletal: Normal range of motion.  Neurological: She is alert and oriented to person, place, and time. No cranial nerve deficit.  Skin: Skin is warm and dry. No rash noted.  Psychiatric: She has a normal mood and affect. Her behavior is normal.    ED Course  Procedures (including critical care time)  Date: 04/23/2012  Rate: 68  Rhythm: normal sinus rhythm  QRS Axis: normal  Intervals: normal  ST/T Wave abnormalities: normal  Conduction Disutrbances: none  Narrative Interpretation: unremarkable     Labs Reviewed  POCT I-STAT, CHEM 8 - Abnormal; Notable for the following:    Calcium, Ion 1.28 (*)     All other components within normal  limits  POCT I-STAT TROPONIN I   Dg Chest 2 View  04/23/2012  *RADIOLOGY REPORT*  Clinical Data: Chest pain  CHEST - 2 VIEW  Comparison: Coronary CTA 04/20/2012; chest x-ray also 04/20/2012  Findings: The lungs are well-aerated and free from pulmonary edema, focal airspace consolidation or pulmonary nodule.  Cardiac and mediastinal contours are within normal limits.  No pneumothorax, or pleural effusion. No acute osseous findings.  IMPRESSION:  No acute cardiopulmonary disease.   Original Report Authenticated By: Malachy Moan, M.D.      1. Pericarditis       MDM  Patient was seen and evaluated at Dr. Garnette Scheuermann in cardiology.  Followup instructions per Dr. Katrinka Blazing.       Nelia Shi, MD 04/23/12 [phone]

## 2012-04-23 NOTE — ED Notes (Signed)
Dr. Smith, Cardiology at bedside.  

## 2012-04-29 ENCOUNTER — Ambulatory Visit (INDEPENDENT_AMBULATORY_CARE_PROVIDER_SITE_OTHER): Payer: 59

## 2012-04-29 ENCOUNTER — Encounter: Payer: Self-pay | Admitting: Gynecology

## 2012-04-29 ENCOUNTER — Ambulatory Visit (INDEPENDENT_AMBULATORY_CARE_PROVIDER_SITE_OTHER): Payer: 59 | Admitting: Gynecology

## 2012-04-29 DIAGNOSIS — N831 Corpus luteum cyst of ovary, unspecified side: Secondary | ICD-10-CM

## 2012-04-29 DIAGNOSIS — E7800 Pure hypercholesterolemia, unspecified: Secondary | ICD-10-CM

## 2012-04-29 DIAGNOSIS — N83209 Unspecified ovarian cyst, unspecified side: Secondary | ICD-10-CM

## 2012-04-29 DIAGNOSIS — N915 Oligomenorrhea, unspecified: Secondary | ICD-10-CM

## 2012-04-29 DIAGNOSIS — D251 Intramural leiomyoma of uterus: Secondary | ICD-10-CM

## 2012-04-29 DIAGNOSIS — D259 Leiomyoma of uterus, unspecified: Secondary | ICD-10-CM

## 2012-04-29 DIAGNOSIS — N83201 Unspecified ovarian cyst, right side: Secondary | ICD-10-CM

## 2012-04-29 LAB — LIPID PANEL
HDL: 52 mg/dL (ref >39)
LDL Cholesterol: 160 mg/dL — ABNORMAL HIGH (ref 0–99)
Total CHOL/HDL Ratio: 4.5 Ratio
Triglycerides: 122 mg/dL (ref <150)
VLDL: 24 mg/dL (ref 0–40)

## 2012-04-29 NOTE — Progress Notes (Signed)
Patient presented to the office today for followup ultrasound infecting she's had history of ovarian cyst in the past. She was seen in the office on December 19 her annual gynecological examination. See previous note for detail. Previous ultrasound as follows:  Patient was last seen the office in 2012 she had an ultrasound at that time which demonstrated the following:  uterus to measure 4.6 x 5.6 x 4.6 cm endometrial stripe 12.9 mm patient on day 12 of her cycle today. Ultrasound demonstrated small intramural myoma measuring 20 x 21 mm her right ovary there was a thick walled cyst with a solid area positive color flow within the mass and periphery measuring 23 x 20 mm suspicious for corpus luteum cyst/hemorrhagic. Left ovary her irregular cystic measured 13 x 17 x 10 mm with a ground glass internal low at the negative color flow.   Followup ultrasound today: Uterus measured 9.0 x 5.9 x 5.1 cm with endometrial stripe of 4.9 mm (last menstrual period 03/30/2012). One small intramural fibroid measuring 21 x 27 mm unchanged. And a smaller one 22 mm. Right ovary was normal. A left ovarian follicle measuring 18 x 18 mm was noted was some color flow at the periphery.  Patient was having episodes of oligomenorrhea recently and a TSH was drawn which was normal. Her prolactin will be drawn today. She is using condoms for contraception and is not sure she's going to use the nonhormonal ParaGard T380A IUD in the future. Literature information was provided. I had previously given a prescription of Provera 10 mg to take 1 by mouth daily for 5-10 days of each month if she did not have a spontaneous menses every 35 days if the above tests come back normal. She was reminded to do a urine pregnancy test before she takes the Provera dose months that she may need it. She's in the process of scheduling her mammogram and was encouraged to do her monthly self breast examination. We'll otherwise see her back in one year or when  necessary. Her primary physician has done all her other yearly lab work.

## 2012-04-29 NOTE — Patient Instructions (Signed)
Cholesterol Control Diet Cholesterol levels in your body are determined significantly by your diet. Cholesterol levels may also be related to heart disease. The following material helps to explain this relationship and discusses what you can do to help keep your heart healthy. Not all cholesterol is bad. Low-density lipoprotein (LDL) cholesterol is the "bad" cholesterol. It may cause fatty deposits to build up inside your arteries. High-density lipoprotein (HDL) cholesterol is "good." It helps to remove the "bad" LDL cholesterol from your blood. Cholesterol is a very important risk factor for heart disease. Other risk factors are high blood pressure, smoking, stress, heredity, and weight. The heart muscle gets its supply of blood through the coronary arteries. If your LDL cholesterol is high and your HDL cholesterol is low, you are at risk for having fatty deposits build up in your coronary arteries. This leaves less room through which blood can flow. Without sufficient blood and oxygen, the heart muscle cannot function properly and you may feel chest pains (angina pectoris). When a coronary artery closes up entirely, a part of the heart muscle may die, causing a heart attack (myocardial infarction). CHECKING CHOLESTEROL When your caregiver sends your blood to a lab to be analyzed for cholesterol, a complete lipid (fat) profile may be done. With this test, the total amount of cholesterol and levels of LDL and HDL are determined. Triglycerides are a type of fat that circulates in the blood and can also be used to determine heart disease risk. The list below describes what the numbers should be: Test: Total Cholesterol.  Less than 200 mg/dl.  Test: LDL "bad cholesterol."  Less than 100 mg/dl.   Less than 70 mg/dl if you are at very high risk of a heart attack or sudden cardiac death.  Test: HDL "good cholesterol."  Greater than 50 mg/dl for women.   Greater than 40 mg/dl for men.  Test:  Triglycerides.  Less than 150 mg/dl.  CONTROLLING CHOLESTEROL WITH DIET Although exercise and lifestyle factors are important, your diet is key. That is because certain foods are known to raise cholesterol and others to lower it. The goal is to balance foods for their effect on cholesterol and more importantly, to replace saturated and trans fat with other types of fat, such as monounsaturated fat, polyunsaturated fat, and omega-3 fatty acids. On average, a person should consume no more than 15 to 17 g of saturated fat daily. Saturated and trans fats are considered "bad" fats, and they will raise LDL cholesterol. Saturated fats are primarily found in animal products such as meats, butter, and cream. However, that does not mean you need to sacrifice all your favorite foods. Today, there are good tasting, low-fat, low-cholesterol substitutes for most of the things you like to eat. Choose low-fat or nonfat alternatives. Choose round or loin cuts of red meat, since these types of cuts are lowest in fat and cholesterol. Chicken (without the skin), fish, veal, and ground turkey breast are excellent choices. Eliminate fatty meats, such as hot dogs and salami. Even shellfish have little or no saturated fat. Have a 3 oz (85 g) portion when you eat lean meat, poultry, or fish. Trans fats are also called "partially hydrogenated oils." They are oils that have been scientifically manipulated so that they are solid at room temperature resulting in a longer shelf life and improved taste and texture of foods in which they are added. Trans fats are found in stick margarine, some tub margarines, cookies, crackers, and baked goods.  When   baking and cooking, oils are an excellent substitute for butter. The monounsaturated oils are especially beneficial since it is believed they lower LDL and raise HDL. The oils you should avoid entirely are saturated tropical oils, such as coconut and palm.  Remember to eat liberally from food  groups that are naturally free of saturated and trans fat, including fish, fruit, vegetables, beans, grains (barley, rice, couscous, bulgur wheat), and pasta (without cream sauces).  IDENTIFYING FOODS THAT LOWER CHOLESTEROL  Soluble fiber may lower your cholesterol. This type of fiber is found in fruits such as apples, vegetables such as broccoli, potatoes, and carrots, legumes such as beans, peas, and lentils, and grains such as barley. Foods fortified with plant sterols (phytosterol) may also lower cholesterol. You should eat at least 2 g per day of these foods for a cholesterol lowering effect.  Read package labels to identify low-saturated fats, trans fats free, and low-fat foods at the supermarket. Select cheeses that have only 2 to 3 g saturated fat per ounce. Use a heart-healthy tub margarine that is free of trans fats or partially hydrogenated oil. When buying baked goods (cookies, crackers), avoid partially hydrogenated oils. Breads and muffins should be made from whole grains (whole-wheat or whole oat flour, instead of "flour" or "enriched flour"). Buy non-creamy canned soups with reduced salt and no added fats.  FOOD PREPARATION TECHNIQUES  Never deep-fry. If you must fry, either stir-fry, which uses very little fat, or use non-stick cooking sprays. When possible, broil, bake, or roast meats, and steam vegetables. Instead of dressing vegetables with butter or margarine, use lemon and herbs, applesauce and cinnamon (for squash and sweet potatoes), nonfat yogurt, salsa, and low-fat dressings for salads.  LOW-SATURATED FAT / LOW-FAT FOOD SUBSTITUTES Meats / Saturated Fat (g)  Avoid: Steak, marbled (3 oz/85 g) / 11 g   Choose: Steak, lean (3 oz/85 g) / 4 g   Avoid: Hamburger (3 oz/85 g) / 7 g   Choose: Hamburger, lean (3 oz/85 g) / 5 g   Avoid: Ham (3 oz/85 g) / 6 g   Choose: Ham, lean cut (3 oz/85 g) / 2.4 g   Avoid: Chicken, with skin, dark meat (3 oz/85 g) / 4 g   Choose: Chicken,  skin removed, dark meat (3 oz/85 g) / 2 g   Avoid: Chicken, with skin, light meat (3 oz/85 g) / 2.5 g   Choose: Chicken, skin removed, light meat (3 oz/85 g) / 1 g  Dairy / Saturated Fat (g)  Avoid: Whole milk (1 cup) / 5 g   Choose: Low-fat milk, 2% (1 cup) / 3 g   Choose: Low-fat milk, 1% (1 cup) / 1.5 g   Choose: Skim milk (1 cup) / 0.3 g   Avoid: Hard cheese (1 oz/28 g) / 6 g   Choose: Skim milk cheese (1 oz/28 g) / 2 to 3 g   Avoid: Cottage cheese, 4% fat (1 cup) / 6.5 g   Choose: Low-fat cottage cheese, 1% fat (1 cup) / 1.5 g   Avoid: Ice cream (1 cup) / 9 g   Choose: Sherbet (1 cup) / 2.5 g   Choose: Nonfat frozen yogurt (1 cup) / 0.3 g   Choose: Frozen fruit bar / trace   Avoid: Whipped cream (1 tbs) / 3.5 g   Choose: Nondairy whipped topping (1 tbs) / 1 g  Condiments / Saturated Fat (g)  Avoid: Mayonnaise (1 tbs) / 2 g   Choose: Low-fat mayonnaise (  1 tbs) / 1 g   Avoid: Butter (1 tbs) / 7 g   Choose: Extra light margarine (1 tbs) / 1 g   Avoid: Coconut oil (1 tbs) / 11.8 g   Choose: Olive oil (1 tbs) / 1.8 g   Choose: Corn oil (1 tbs) / 1.7 g   Choose: Safflower oil (1 tbs) / 1.2 g   Choose: Sunflower oil (1 tbs) / 1.4 g   Choose: Soybean oil (1 tbs) / 2.4 g   Choose: Canola oil (1 tbs) / 1 g  Document Released: 04/08/2005 Document Revised: 12/19/2010 Document Reviewed: 09/27/2010 [employer] Patient Information 2012 [employer]. 

## 2012-04-30 ENCOUNTER — Other Ambulatory Visit: Payer: Self-pay | Admitting: Gynecology

## 2012-04-30 DIAGNOSIS — E7800 Pure hypercholesterolemia, unspecified: Secondary | ICD-10-CM

## 2012-05-01 ENCOUNTER — Encounter: Payer: Self-pay | Admitting: Gynecology

## 2012-06-23 ENCOUNTER — Encounter: Payer: Self-pay | Admitting: *Deleted

## 2012-06-24 ENCOUNTER — Ambulatory Visit (INDEPENDENT_AMBULATORY_CARE_PROVIDER_SITE_OTHER): Payer: 59 | Admitting: *Deleted

## 2012-06-24 DIAGNOSIS — I781 Nevus, non-neoplastic: Secondary | ICD-10-CM

## 2012-06-24 NOTE — Progress Notes (Signed)
Consulted with the patient who said she was here to have her ankle swelling and leg pain relieved. I told her sclero would not relieve her symptoms, it would only help with cosmetic concerns. We reviewed her duplex exam which shows reflux bilaterally but her vein diameter is too small for ins to cover. She has only two areas where spiders are present and I would have used maybe 2cc of solution. I suggested that she wait until she has more spider veins to treat and that that would be a better use of her money. She was appreciative and seems to understand her condition better. We went over compression, elevation, Ibuprofen and exercise to help relieve her swelling and pain. Follow prn.  Photos: yes

## 2013-01-18 ENCOUNTER — Other Ambulatory Visit: Payer: Self-pay

## 2013-01-18 DIAGNOSIS — Z1231 Encounter for screening mammogram for malignant neoplasm of breast: Secondary | ICD-10-CM

## 2013-02-02 ENCOUNTER — Ambulatory Visit
Admission: RE | Admit: 2013-02-02 | Discharge: 2013-02-02 | Disposition: A | Discharge status: Home / Self Care | Payer: 59 | Source: Ambulatory Visit

## 2013-02-02 DIAGNOSIS — Z1231 Encounter for screening mammogram for malignant neoplasm of breast: Secondary | ICD-10-CM

## 2013-04-12 ENCOUNTER — Encounter: Payer: 59 | Admitting: Gynecology

## 2013-05-11 ENCOUNTER — Encounter: Payer: Self-pay | Admitting: Gynecology

## 2013-05-11 ENCOUNTER — Ambulatory Visit (INDEPENDENT_AMBULATORY_CARE_PROVIDER_SITE_OTHER): Payer: 59 | Admitting: Gynecology

## 2013-05-11 ENCOUNTER — Other Ambulatory Visit (HOSPITAL_COMMUNITY)
Admission: RE | Admit: 2013-05-11 | Discharge: 2013-05-11 | Disposition: A | Discharge status: Home / Self Care | Payer: 59 | Source: Ambulatory Visit | Attending: Gynecology | Admitting: Gynecology

## 2013-05-11 VITALS — BP 122/80 | Ht 64.0 in | Wt 162.5 lb

## 2013-05-11 DIAGNOSIS — Z23 Encounter for immunization: Secondary | ICD-10-CM

## 2013-05-11 DIAGNOSIS — Z1151 Encounter for screening for human papillomavirus (HPV): Secondary | ICD-10-CM | POA: Insufficient documentation

## 2013-05-11 DIAGNOSIS — Z01419 Encounter for gynecological examination (general) (routine) without abnormal findings: Secondary | ICD-10-CM | POA: Insufficient documentation

## 2013-05-11 NOTE — Progress Notes (Signed)
Kimberly Vargas Jan 13, 1971 [id number]   History:    43 y.o.  for annual gyn exam with no complaints today. Patient using condoms for contraception. Patient reported normal menstrual cycle. Patient's flu vaccine is up to date. Patient did not receive the Tdap vaccine in over 10 years. Patient's last Pap smear was in 2011.  Past medical history,surgical history, family history and social history were all reviewed and documented in the EPIC chart.  Gynecologic History Patient's last menstrual period was 04/28/2013. Contraception: condoms Last Pap: 2011. Results were: normal Last mammogram: 2014. Results were: normal  Obstetric History OB History  Gravida Para Term Preterm AB SAB TAB Ectopic Multiple Living  3 2  2 1   1  2     # Outcome Date GA Lbr Len/2nd Weight Sex Delivery Anes PTL Lv  3 ECT           2 PRE           1 PRE                ROS: A ROS was performed and pertinent positives and negatives are included in the history.  GENERAL: No fevers or chills. HEENT: No change in vision, no earache, sore throat or sinus congestion. NECK: No pain or stiffness. CARDIOVASCULAR: No chest pain or pressure. No palpitations. PULMONARY: No shortness of breath, cough or wheeze. GASTROINTESTINAL: No abdominal pain, nausea, vomiting or diarrhea, melena or bright red blood per rectum. GENITOURINARY: No urinary frequency, urgency, hesitancy or dysuria. MUSCULOSKELETAL: No joint or muscle pain, no back pain, no recent trauma. DERMATOLOGIC: No rash, no itching, no lesions. ENDOCRINE: No polyuria, polydipsia, no heat or cold intolerance. No recent change in weight. HEMATOLOGICAL: No anemia or easy bruising or bleeding. NEUROLOGIC: No headache, seizures, numbness, tingling or weakness. PSYCHIATRIC: No depression, no loss of interest in normal activity or change in sleep pattern.     Exam: chaperone present  BP 122/80  Ht 5\' 4"  (1.626 m)  Wt 162 lb 8 oz (73.71 kg)  BMI 27.88 kg/m2  LMP  04/28/2013  Body mass index is 27.88 kg/(m^2).  General appearance : Well developed well nourished female. No acute distress HEENT: Neck supple, trachea midline, no carotid bruits, no thyroidmegaly Lungs: Clear to auscultation, no rhonchi or wheezes, or rib retractions  Heart: Regular rate and rhythm, no murmurs or gallops Breast:Examined in sitting and supine position were symmetrical in appearance, no palpable masses or tenderness,  no skin retraction, no nipple inversion, no nipple discharge, no skin discoloration, no axillary or supraclavicular lymphadenopathy Abdomen: no palpable masses or tenderness, no rebound or guarding Extremities: no edema or skin discoloration or tenderness  Pelvic:  Bartholin, Urethra, Skene Glands: Within normal limits             Vagina: No gross lesions or discharge  Cervix: No gross lesions or discharge  Uterus  anteverted, normal size, shape and consistency, non-tender and mobile  Adnexa  Without masses or tenderness  Anus and perineum  normal   Rectovaginal  normal sphincter tone without palpated masses or tenderness             Hemoccult not indicated     Assessment/Plan:  43 y.o. female for annual exam was encouraged to begin taking calcium and vitamin D for osteoporosis prevention. Pap smear with HPV screen was done today. The following labs were ordered: CBC, comprehensive metabolic panel, screening cholesterol, urinalysis, and TSH. Patient received the Tdap vaccine today. We also  discussed the importance of monthly breast exam.  Note: This dictation was prepared with  Dragon/digital dictation along withSmart phrase technology. Any transcriptional errors that result from this process are unintentional.   Terrance Mass MD, 3:57 PM 05/11/2013

## 2013-05-11 NOTE — Patient Instructions (Signed)
Tetanus, Diphtheria (Td) Vaccine What You Need to Know WHY GET VACCINATED? Tetanus  and diphtheria are very serious diseases. They are rare in the [place] today, but people who do become infected often have severe complications. Td vaccine is used to protect adolescents and adults from both of these diseases. Both tetanus and diphtheria are infections caused by bacteria. Diphtheria spreads from person to person through coughing or sneezing. Tetanus-causing bacteria enter the body through cuts, scratches, or wounds. TETANUS (Lockjaw) causes painful muscle tightening and stiffness, usually all over the body.  It can lead to tightening of muscles in the head and neck so you can't open your mouth, swallow, or sometimes even breathe. Tetanus kills about 1 out of every 5 people who are infected. DIPHTHERIA can cause a thick coating to form in the back of the throat.  It can lead to breathing problems, paralysis, heart failure, and death. Before vaccines, the [place] saw as many as 200,000 cases a year of diphtheria and hundreds of cases of tetanus. Since vaccination began, cases of both diseases have dropped by about 99%. TD VACCINE Td vaccine can protect adolescents and adults from tetanus and diphtheria. Td is usually given as a booster dose every 10 years but it can also be given earlier after a severe and dirty wound or burn. Your doctor can give you more information. Td may safely be given at the same time as other vaccines. SOME PEOPLE SHOULD NOT GET THIS VACCINE  If you ever had a life-threatening allergic reaction after a dose of any tetanus or diphtheria containing vaccine, OR if you have a severe allergy to any part of this vaccine, you should not get Td. Tell your doctor if you have any severe allergies.  Talk to your doctor if you:  have epilepsy or another nervous system problem,  had severe pain or swelling after any vaccine containing diphtheria or tetanus,  ever had  Guillain Barr Syndrome (GBS),  aren't feeling well on the day the shot is scheduled. RISKS OF A VACCINE REACTION With a vaccine, like any medicine, there is a chance of side effects. These are usually mild and go away on their own. Serious side effects are also possible, but are very rare. Most people who get Td vaccine do not have any problems with it. Mild Problems  following Td (Did not interfere with activities)  Pain where the shot was given (about 8 people in 10)  Redness or swelling where the shot was given (about 1 person in 3)  Mild fever (about 1 person in 15)  Headache or Tiredness (uncommon) Moderate Problems following Td (Interfered with activities, but did not require medical attention)  Fever over 102 F (38.9 C) (rare) Severe Problems  following Td (Unable to perform usual activities; required medical attention)  Swelling, severe pain, bleeding, or redness in the arm where the shot was given (rare). Problems that could happen after any vaccine:  Brief fainting spells can happen after any medical procedure, including vaccination. Sitting or lying down for about 15 minutes can help prevent fainting, and injuries caused by a fall. Tell your doctor if you feel dizzy, or have vision changes or ringing in the ears.  Severe shoulder pain and reduced range of motion in the arm where a shot was given can happen, very rarely, after a vaccination.  Severe allergic reactions from a vaccine are very rare, estimated at less than 1 in a million doses. If one were to occur, it would   usually be within a few minutes to a few hours after the vaccination. WHAT IF THERE IS A SERIOUS REACTION? What should I look for?  Look for anything that concerns you, such as signs of a severe allergic reaction, very high fever, or behavior changes. Signs of a severe allergic reaction can include hives, swelling of the face and throat, difficulty breathing, a fast heartbeat, dizziness, and  weakness. These would usually start a few minutes to a few hours after the vaccination. What should I do?  If you think it is a severe allergic reaction or other emergency that can't wait, call 911 or get the person to the nearest hospital. Otherwise, call your doctor.  Afterward, the reaction should be reported to the Vaccine Adverse Event Reporting System (VAERS). Your doctor might file this report, or, you can do it yourself through the VAERS website or by calling 1-800-822-7967. VAERS is only for reporting reactions. They do not give medical advice. THE NATIONAL VACCINE INJURY COMPENSATION PROGRAM The National Vaccine Injury Compensation Program (VICP) is a federal program that was created to compensate people who may have been injured by certain vaccines. Persons who believe they may have been injured by a vaccine can learn about the program and about filing a claim by calling 1-800-338-2382 or visiting the VICP website. HOW CAN I LEARN MORE?  Ask your doctor.  Contact your local or state health department.  Contact the Centers for Disease Control and Prevention (CDC):  Call 1-800-232-4636 (1-800-CDC-INFO)  Visit CDC's vaccines website CDC Td Vaccine Interim VIS (05/26/12) Document Released: 02/03/2006 Document Revised: 08/03/2012 Document Reviewed: 07/29/2012 [employer] Patient Information 2014 [employer].  

## 2013-05-12 LAB — COMPREHENSIVE METABOLIC PANEL
ALBUMIN: 4.3 g/dL (ref 3.5–5.2)
ALT: 13 U/L (ref 0–35)
AST: 19 U/L (ref 0–37)
Alkaline Phosphatase: 45 U/L (ref 39–117)
BUN: 8 mg/dL (ref 6–23)
CALCIUM: 9.4 mg/dL (ref 8.4–10.5)
CHLORIDE: 100 meq/L (ref 96–112)
CO2: 30 meq/L (ref 19–32)
CREATININE: 0.87 mg/dL (ref 0.50–1.10)
GLUCOSE: 84 mg/dL (ref 70–99)
POTASSIUM: 4 meq/L (ref 3.5–5.3)
Sodium: 137 mEq/L (ref 135–145)
Total Bilirubin: 0.9 mg/dL (ref 0.3–1.2)
Total Protein: 6.8 g/dL (ref 6.0–8.3)

## 2013-05-12 LAB — TSH: TSH: 1.447 u[IU]/mL (ref 0.350–4.500)

## 2013-05-12 LAB — URINALYSIS W MICROSCOPIC + REFLEX CULTURE
Bacteria, UA: NONE SEEN
Bilirubin Urine: NEGATIVE
Casts: NONE SEEN
Crystals: NONE SEEN
GLUCOSE, UA: NEGATIVE mg/dL
HGB URINE DIPSTICK: NEGATIVE
Ketones, ur: NEGATIVE mg/dL
LEUKOCYTES UA: NEGATIVE
Nitrite: NEGATIVE
PROTEIN: NEGATIVE mg/dL
SQUAMOUS EPITHELIAL / LPF: NONE SEEN
Specific Gravity, Urine: 1.026 (ref 1.005–1.030)
UROBILINOGEN UA: 0.2 mg/dL (ref 0.0–1.0)
pH: 6.5 (ref 5.0–8.0)

## 2013-05-12 LAB — CBC WITH DIFFERENTIAL/PLATELET
BASOS ABS: 0 10*3/uL (ref 0.0–0.1)
Basophils Relative: 1 % (ref 0–1)
EOS PCT: 2 % (ref 0–5)
Eosinophils Absolute: 0.1 10*3/uL (ref 0.0–0.7)
HEMATOCRIT: 39.3 % (ref 36.0–46.0)
HEMOGLOBIN: 13.2 g/dL (ref 12.0–15.0)
LYMPHS ABS: 1.2 10*3/uL (ref 0.7–4.0)
LYMPHS PCT: 31 % (ref 12–46)
MCH: 30.6 pg (ref 26.0–34.0)
MCHC: 33.6 g/dL (ref 30.0–36.0)
MCV: 91.2 fL (ref 78.0–100.0)
MONO ABS: 0.3 10*3/uL (ref 0.1–1.0)
MONOS PCT: 9 % (ref 3–12)
NEUTROS ABS: 2.1 10*3/uL (ref 1.7–7.7)
Neutrophils Relative %: 57 % (ref 43–77)
Platelets: 333 10*3/uL (ref 150–400)
RBC: 4.31 MIL/uL (ref 3.87–5.11)
RDW: 12.7 % (ref 11.5–15.5)
WBC: 3.7 10*3/uL — AB (ref 4.0–10.5)

## 2013-05-12 LAB — CHOLESTEROL, TOTAL: CHOLESTEROL: 232 mg/dL — AB (ref 0–200)

## 2013-05-17 ENCOUNTER — Other Ambulatory Visit: Payer: Self-pay | Admitting: Gynecology

## 2013-05-17 DIAGNOSIS — E7800 Pure hypercholesterolemia, unspecified: Secondary | ICD-10-CM

## 2013-07-22 ENCOUNTER — Emergency Department (HOSPITAL_COMMUNITY)
Admission: EM | Admit: 2013-07-22 | Discharge: 2013-07-22 | Disposition: A | Discharge status: Home / Self Care | Payer: 59 | Attending: Emergency Medicine | Admitting: Emergency Medicine

## 2013-07-22 ENCOUNTER — Encounter (HOSPITAL_COMMUNITY): Payer: Self-pay | Admitting: Emergency Medicine

## 2013-07-22 ENCOUNTER — Emergency Department (HOSPITAL_COMMUNITY): Payer: 59

## 2013-07-22 DIAGNOSIS — R6884 Jaw pain: Secondary | ICD-10-CM | POA: Insufficient documentation

## 2013-07-22 DIAGNOSIS — R209 Unspecified disturbances of skin sensation: Secondary | ICD-10-CM | POA: Insufficient documentation

## 2013-07-22 DIAGNOSIS — Z862 Personal history of diseases of the blood and blood-forming organs and certain disorders involving the immune mechanism: Secondary | ICD-10-CM | POA: Insufficient documentation

## 2013-07-22 DIAGNOSIS — R0789 Other chest pain: Secondary | ICD-10-CM | POA: Insufficient documentation

## 2013-07-22 DIAGNOSIS — Z79899 Other long term (current) drug therapy: Secondary | ICD-10-CM | POA: Insufficient documentation

## 2013-07-22 DIAGNOSIS — R079 Chest pain, unspecified: Secondary | ICD-10-CM

## 2013-07-22 DIAGNOSIS — M542 Cervicalgia: Secondary | ICD-10-CM

## 2013-07-22 LAB — BASIC METABOLIC PANEL
BUN: 17 mg/dL (ref 6–23)
CALCIUM: 9.3 mg/dL (ref 8.4–10.5)
CO2: 26 mEq/L (ref 19–32)
CREATININE: 1.06 mg/dL (ref 0.50–1.10)
Chloride: 105 mEq/L (ref 96–112)
GFR calc Af Amer: 73 mL/min — ABNORMAL LOW (ref >90)
GFR, EST NON AFRICAN AMERICAN: 63 mL/min — AB (ref >90)
GLUCOSE: 103 mg/dL — AB (ref 70–99)
Potassium: 4.4 mEq/L (ref 3.7–5.3)
Sodium: 143 mEq/L (ref 137–147)

## 2013-07-22 LAB — CBC
HCT: 39.8 % (ref 36.0–46.0)
HEMOGLOBIN: 13.3 g/dL (ref 12.0–15.0)
MCH: 31.7 pg (ref 26.0–34.0)
MCHC: 33.4 g/dL (ref 30.0–36.0)
MCV: 94.8 fL (ref 78.0–100.0)
Platelets: 252 10*3/uL (ref 150–400)
RBC: 4.2 MIL/uL (ref 3.87–5.11)
RDW: 12.1 % (ref 11.5–15.5)
WBC: 3.8 10*3/uL — ABNORMAL LOW (ref 4.0–10.5)

## 2013-07-22 LAB — I-STAT TROPONIN, ED
Troponin i, poc: 0 ng/mL (ref 0.00–0.08)
Troponin i, poc: 0 ng/mL (ref 0.00–0.08)

## 2013-07-22 LAB — D-DIMER, QUANTITATIVE: D-Dimer, Quant: <0.27 ug/mL-FEU (ref 0.00–0.48)

## 2013-07-22 MED ORDER — ASPIRIN 81 MG PO CHEW
324.0000 mg | CHEWABLE_TABLET | Freq: Once | ORAL | Status: AC
  Administered 2013-07-22: 324 mg via ORAL
  Filled 2013-07-22: qty 4

## 2013-07-22 MED ORDER — GI COCKTAIL ~~LOC~~
30.0000 mL | Freq: Once | ORAL | Status: AC
Start: 2013-07-22 — End: 2013-07-22
  Administered 2013-07-22: 30 mL via ORAL
  Filled 2013-07-22: qty 30

## 2013-07-22 MED ORDER — FAMOTIDINE 20 MG PO TABS: 20.0000 mg | ORAL_TABLET | Freq: Two times a day (BID) | ORAL | Status: DC

## 2013-07-22 NOTE — ED Notes (Signed)
Pt reports left side chest pains since yesterday. Having left side neck pains intermittent for past week and this am having pain to jaw and feeling lightheaded.

## 2013-07-22 NOTE — Discharge Instructions (Signed)
Call for a follow up appointment with a Family or Primary Care Provider.  Call [facility] for further evaluation of your chest pain. Return to the Emergency department if Symptoms worsen.   Take medication as prescribed.

## 2013-07-22 NOTE — ED Notes (Signed)
Patient transported to X-ray 

## 2013-07-22 NOTE — ED Notes (Signed)
PA at bedside.

## 2013-07-22 NOTE — ED Notes (Signed)
Pt brought to room with family in tow; pt now getting undressed and into a gown at this time

## 2013-07-22 NOTE — ED Provider Notes (Signed)
CSN: [id number]     Arrival date & time 07/22/13  0709 History   First MD Initiated Contact with Patient 07/22/13 [phone]     Chief Complaint  Patient presents with  . Chest Pain  . Jaw Pain     (Consider location/radiation/quality/duration/timing/severity/associated sxs/prior Treatment) HPI Comments: Kimberly Vargas is a 43 y.o. female with a past medical history of Anemia presenting the Emergency Department with a chief complaint of left sided chest discomfort for 2 days.  The patient reports pleuritic chest discomfort since yesterday.  The patient reports she thought she had "gas" she needed to "work out" but it has not subsided. She states deep inspirations aggravate chest discomfort. She reports associated left soft tissue neck discomfort and left jaw numbness upon waking this morning.  She denies dyspnea, cough, fever, palpitations, or lower extremity edema.  She reports early MI in her father. Reports history of DVT in father after a surgery.No recent travel, personal history of DVT/PE, lower extremity swelling, smoking, cancer, or exogenous estrogen.     The history is provided by the patient and medical records. No language interpreter was used.    Past Medical History  Diagnosis Date  . Ectopic pregnancy 2001    treated with methotrexate  . NSVD (normal spontaneous vaginal delivery)   . PROM (premature rupture of membranes)     DELIVERY AT 34 WKS  . Anemia    Past Surgical History  Procedure Laterality Date  . Resectoscopic polypectomy  12/25/2005  . Dilation and curettage of uterus     Family History  Problem Relation Age of Onset  . Hypertension Father   . Heart disease Father     before age 61  . Diabetes Father   . Hyperlipidemia Father   . Heart attack Father   . Hypertension Sister   . Diabetes Paternal Grandmother   . Other Mother     varicose veins  . Cancer Maternal Grandmother     OVARIAN   History  Substance Use Topics  . Smoking status: Never Smoker    . Smokeless tobacco: Never Used  . Alcohol Use: No   OB History   Grav Para Term Preterm Abortions TAB SAB Ect Mult Living   3 2  2 1   1  2      Review of Systems  Constitutional: Negative for fever and chills.  Respiratory: Negative for cough, chest tightness and shortness of breath.   Cardiovascular: Positive for chest pain. Negative for leg swelling.  Neurological: Positive for numbness. Negative for facial asymmetry.      Allergies  Review of patient's allergies indicates no known allergies.  Home Medications   Current Outpatient Rx  Name  Route  Sig  Dispense  Refill  . CALCIUM PO   Oral   Take 1 tablet by mouth daily.         Marland Kitchen ibuprofen (ADVIL,MOTRIN) 200 MG tablet   Oral   Take 600 mg by mouth every 6 (six) hours as needed. For pain         . Multiple Vitamin (MULTIVITAMIN) tablet   Oral   Take 1 tablet by mouth daily.          BP 100/66  Pulse 55  Temp(Src) 98 F (36.7 C) (Oral)  Resp 11  Ht 5\' 4"  (1.626 m)  Wt 154 lb (69.854 kg)  BMI 26.42 kg/m2  SpO2 95%  LMP 05/12/2013 Physical Exam  Nursing note and vitals reviewed. Constitutional: She is  oriented to person, place, and time. She appears well-developed and well-nourished. No distress.  HENT:  Head: Normocephalic and atraumatic.  Mouth/Throat: Uvula is midline, oropharynx is clear and moist and mucous membranes are normal. Mucous membranes are not dry. No trismus in the jaw. No oropharyngeal exudate, posterior oropharyngeal edema or posterior oropharyngeal erythema.  Eyes: Pupils are equal, round, and reactive to light.  Neck: Normal range of motion. Neck supple. No muscular tenderness present.  Discomfort not reproducible with palpation.  Cardiovascular: Normal rate and regular rhythm.   No lower extremity edema  Pulmonary/Chest: Effort normal and breath sounds normal. No respiratory distress. She has no decreased breath sounds. She has no wheezes. She has no rales. She exhibits no  tenderness.  Patient is able to speak in complete sentences.  No rash on chest wall.    Abdominal: Soft. She exhibits no distension. There is no tenderness. There is no guarding.  Lymphadenopathy:       Head (right side): No submental, no submandibular, no tonsillar, no preauricular, no posterior auricular and no occipital adenopathy present.       Head (left side): No submental, no submandibular, no tonsillar, no preauricular, no posterior auricular and no occipital adenopathy present.    She has no cervical adenopathy.       Right cervical: No superficial cervical and no deep cervical adenopathy present.      Left cervical: No superficial cervical and no deep cervical adenopathy present.  Neurological: She is alert and oriented to person, place, and time.  Skin: Skin is warm and dry. No rash noted. She is not diaphoretic.  Psychiatric: She has a normal mood and affect. Her behavior is normal.    ED Course  Procedures (including critical care time) Labs Review Labs Reviewed  CBC - Abnormal; Notable for the following:    WBC 3.8 (*)    All other components within normal limits  BASIC METABOLIC PANEL - Abnormal; Notable for the following:    Glucose, Bld 103 (*)    GFR calc non Af Amer 63 (*)    GFR calc Af Amer 73 (*)    All other components within normal limits  D-DIMER, QUANTITATIVE  I-STAT TROPOININ, ED   Imaging Review Dg Chest 2 View  07/22/2013   CLINICAL DATA:  Chest and left jaw pain.  EXAM: CHEST  2 VIEW  COMPARISON:  04/23/2012  FINDINGS: Heart size is normal. Mediastinal shadows are normal. The lungs are clear. Vascularity is normal. No effusions. No bony abnormalities.  IMPRESSION: Normal chest   Electronically Signed   By: Nelson Chimes M.D.   On: 07/22/2013 07:48     EKG Interpretation   Date/Time:  Thursday July 22 2013 07:15:10 EDT Ventricular Rate:  66 PR Interval:  140 QRS Duration: 64 QT Interval:  402 QTC Calculation: 421 R Axis:   83 Text  Interpretation:  Normal sinus rhythm Cannot rule out Anterior infarct  , age undetermined Abnormal ECG Similar to prior Confirmed by [facility]  MD,  [place] ([id number]) on 07/22/2013 7:19:42 AM      MDM   Final diagnoses:  Chest pain  Neck pain   Pt with a 2 day history of atypical chest pain.  Pt had a CTA on 04/20/2012 that showed: IMPRESSION: No evidence of significant coronary artery atherosclerosis. The patient's total coronary artery calcium score is 0. HEART score low probability. PERC negative for PE. Pt reports pleurisy, D-dimer ordered. 0830 Re-eval: pt reports symptoms improvement, rated at 3/10.  Declines further pain medication at this time. D-dimer- <0.27, negative. Discussed patient history, condition, and labs with Dr. Mingo Amber.  Since the patient is very low risk and chest discomfort is atypical advises second troponin. Troponin x2 negative. Discussed lab results, imaging results, and treatment plan with the patient. Return precautions given. Reports understanding and no other concerns at this time.  Patient is stable for discharge at this time.  Meds given in ED:  Medications  gi cocktail (Maalox,Lidocaine,Donnatal) (30 mLs Oral Given 07/22/13 0815)  aspirin chewable tablet 324 mg (324 mg Oral Given 07/22/13 0814)    Discharge Medication List as of 07/22/2013 10:35 AM    START taking these medications   Details  famotidine (PEPCID) 20 MG tablet Take 1 tablet (20 mg total) by mouth 2 (two) times daily., Starting 07/22/2013, Until Discontinued, Print            Lorrine Kin, PA-C 07/22/13 2034

## 2013-07-23 NOTE — ED Provider Notes (Signed)
Medical screening examination/treatment/procedure(s) were performed by non-physician practitioner and as supervising physician I was immediately available for consultation/collaboration.   EKG Interpretation   Date/Time:  Thursday July 22 2013 07:15:10 EDT Ventricular Rate:  66 PR Interval:  140 QRS Duration: 64 QT Interval:  402 QTC Calculation: 421 R Axis:   83 Text Interpretation:  Normal sinus rhythm Cannot rule out Anterior infarct  , age undetermined Abnormal ECG Similar to prior Confirmed by Mingo Amber  MD,  [place] ([id number]) on 07/22/2013 7:19:42 AM        Osvaldo Shipper, MD 07/23/13 [phone]

## 2013-08-12 ENCOUNTER — Encounter: Payer: Self-pay | Admitting: Gynecology

## 2013-08-12 ENCOUNTER — Ambulatory Visit (INDEPENDENT_AMBULATORY_CARE_PROVIDER_SITE_OTHER): Payer: 59 | Admitting: Gynecology

## 2013-08-12 VITALS — BP 118/76

## 2013-08-12 DIAGNOSIS — E7800 Pure hypercholesterolemia, unspecified: Secondary | ICD-10-CM

## 2013-08-12 DIAGNOSIS — N912 Amenorrhea, unspecified: Secondary | ICD-10-CM

## 2013-08-12 LAB — LIPID PANEL
CHOL/HDL RATIO: 4 ratio
CHOLESTEROL: 215 mg/dL — AB (ref 0–200)
HDL: 54 mg/dL (ref >39)
LDL Cholesterol: 134 mg/dL — ABNORMAL HIGH (ref 0–99)
Triglycerides: 137 mg/dL (ref <150)
VLDL: 27 mg/dL (ref 0–40)

## 2013-08-12 LAB — PREGNANCY, URINE: PREG TEST UR: NEGATIVE

## 2013-08-12 MED ORDER — MEDROXYPROGESTERONE ACETATE 10 MG PO TABS: 10.0000 mg | ORAL_TABLET | Freq: Every day | ORAL | Status: DC

## 2013-08-12 NOTE — Patient Instructions (Signed)
Secondary Amenorrhea   Secondary amenorrhea is the stopping of menstrual flow for 3 6 months in a female who has previously had periods. There are many possible causes. Most of these causes are not serious. Usually, treating the underlying problem causing the loss of menses will return your periods to normal.  CAUSES   Some common and uncommon causes of not menstruating include:   Malnutrition.   Low blood sugar (hypoglycemia).   Polycystic ovary disease.   Stress or fear.   Breastfeeding.   Hormone imbalance.   Ovarian failure.   Medicines.   Extreme obesity.   Cystic fibrosis.   Low body weight or drastic weight reduction from any cause.   Early menopause.   Removal of ovaries or uterus.   Contraceptives.   Illness.   Long-term (chronic) illnesses.   Cushing syndrome.   Thyroid problems.   Birth control pills, patches, or vaginal rings for birth control.  RISK FACTORS  You may be at greater risk of secondary amenorrhea if:   You have a family history of this condition.   You have an eating disorder.   You do athletic training.  DIAGNOSIS   A diagnosis is made by your health care provider taking a medical history and doing a physical exam. This will include a pelvic exam to check for problems with your reproductive organs. Pregnancy must be ruled out. Often, numerous blood tests are done to measure different hormones in the body. Urine testing may be done. Specialized exams (ultrasound, CT scan, MRI, or hysteroscopy) may have to be done as well as measuring the body mass index (BMI).  TREATMENT   Treatment depends on the cause of the amenorrhea. If an eating disorder is present, this can be treated with an adequate diet and therapy. Chronic illnesses may improve with treatment of the illness. Amenorrhea may be corrected with medicines, lifestyle changes, or surgery. If the amenorrhea cannot be corrected, it is sometimes possible to create a false menstruation with medicines.  HOME CARE  INSTRUCTIONS   Maintain a healthy diet.   Manage weight problems.   Exercise regularly but not excessively.   Get adequate sleep.   Manage stress.   Be aware of changes in your menstrual cycle. Keep a record of when your periods occur. Note the date your period starts, how long it lasts, and any problems.  SEEK MEDICAL CARE IF:  Your symptoms do not get better with treatment.  Document Released: 05/20/2006 Document Revised: 12/09/2012 Document Reviewed: 09/24/2012  [employer] Patient Information 2014 [employer].

## 2013-08-12 NOTE — Progress Notes (Signed)
   43 year old presented to the office today that she has not had a menstrual cycle since January of this year. She had been using condoms but has not been sexually active for several months. Patient's complaints and breast tenderness and some abdominal bloating. Patient denies any nipple discharge, or visual disturbances or any unusual headache. Patient was seen in January this year for her annual gynecological examination and had normal blood work.  Exam: Abdomen: Soft nontender no rebound or guarding Pelvic: The urethra Skene was within normal limits Vagina: No lesions or discharge Cervix: No lesions or discharge Uterus: Anteverted normal size shape and consistency no palpable adnexal masses or tenderness Rectal exam: Not done  Urine pregnancy test negative  Assessment/plan: Patient with secondary amenorrhea will rule out thyroid and pituitary dysfunction. We'll also make sure that she is not in premature ovarian failure by checking her [facility] today. She will be prescribed Provera 10 mg to take one pill daily for 5-10 days if the above tests are normal to jump start her cycle. I have given her additional refills in the event that this were to reoccur in the near future she can take the Provera as well providing that she does a home pregnancy test first to make sure that she is not pregnant before taking medication.

## 2013-08-13 LAB — PROLACTIN: PROLACTIN: 11.1 ng/mL

## 2013-08-13 LAB — FOLLICLE STIMULATING HORMONE: FSH: 41.1 m[IU]/mL

## 2013-08-13 LAB — TSH: TSH: 1.484 u[IU]/mL (ref 0.350–4.500)

## 2013-08-13 NOTE — Progress Notes (Signed)
There is no timeframe on waiting for her menses. She may not have another menses ever. Or she may have one in the next few months she is very early into her menopause. She would need to be placed on hormone replacement therapy if and only if she has any vasomotor symptoms. Have her maintain a menstrual calendar for the next 12 months until I see her again. She should use barrier contraception for the next 12 months until we check her [place] again and or when she has not had a menstrual cycle for 12 months. I gave her literature and information on the peri-menopause and hormone replacement therapy as well.

## 2013-10-17 ENCOUNTER — Other Ambulatory Visit (HOSPITAL_COMMUNITY): Payer: 59

## 2013-10-17 ENCOUNTER — Encounter (HOSPITAL_COMMUNITY): Payer: Self-pay | Admitting: Emergency Medicine

## 2013-10-17 ENCOUNTER — Emergency Department (HOSPITAL_COMMUNITY)
Admission: EM | Admit: 2013-10-17 | Discharge: 2013-10-17 | Disposition: A | Discharge status: Home / Self Care | Payer: 59 | Attending: Emergency Medicine | Admitting: Emergency Medicine

## 2013-10-17 ENCOUNTER — Emergency Department (HOSPITAL_COMMUNITY): Payer: 59

## 2013-10-17 DIAGNOSIS — Z79899 Other long term (current) drug therapy: Secondary | ICD-10-CM | POA: Insufficient documentation

## 2013-10-17 DIAGNOSIS — M545 Low back pain, unspecified: Secondary | ICD-10-CM | POA: Insufficient documentation

## 2013-10-17 DIAGNOSIS — Z9889 Other specified postprocedural states: Secondary | ICD-10-CM | POA: Insufficient documentation

## 2013-10-17 DIAGNOSIS — R109 Unspecified abdominal pain: Secondary | ICD-10-CM

## 2013-10-17 DIAGNOSIS — D649 Anemia, unspecified: Secondary | ICD-10-CM | POA: Insufficient documentation

## 2013-10-17 DIAGNOSIS — R1031 Right lower quadrant pain: Secondary | ICD-10-CM | POA: Insufficient documentation

## 2013-10-17 DIAGNOSIS — Z791 Long term (current) use of non-steroidal anti-inflammatories (NSAID): Secondary | ICD-10-CM | POA: Insufficient documentation

## 2013-10-17 DIAGNOSIS — Z3202 Encounter for pregnancy test, result negative: Secondary | ICD-10-CM | POA: Insufficient documentation

## 2013-10-17 LAB — COMPREHENSIVE METABOLIC PANEL
ALT: 12 U/L (ref 0–35)
AST: 19 U/L (ref 0–37)
Albumin: 4 g/dL (ref 3.5–5.2)
Alkaline Phosphatase: 49 U/L (ref 39–117)
BUN: 10 mg/dL (ref 6–23)
CO2: 25 meq/L (ref 19–32)
CREATININE: 0.91 mg/dL (ref 0.50–1.10)
Calcium: 9.5 mg/dL (ref 8.4–10.5)
Chloride: 103 mEq/L (ref 96–112)
GFR calc Af Amer: 88 mL/min — ABNORMAL LOW (ref >90)
GFR, EST NON AFRICAN AMERICAN: 76 mL/min — AB (ref >90)
GLUCOSE: 92 mg/dL (ref 70–99)
Potassium: 4 mEq/L (ref 3.7–5.3)
SODIUM: 141 meq/L (ref 137–147)
Total Bilirubin: 0.6 mg/dL (ref 0.3–1.2)
Total Protein: 7.3 g/dL (ref 6.0–8.3)

## 2013-10-17 LAB — CBC WITH DIFFERENTIAL/PLATELET
Basophils Absolute: 0 10*3/uL (ref 0.0–0.1)
Basophils Relative: 1 % (ref 0–1)
EOS PCT: 3 % (ref 0–5)
Eosinophils Absolute: 0.1 10*3/uL (ref 0.0–0.7)
HEMATOCRIT: 40.3 % (ref 36.0–46.0)
Hemoglobin: 13.3 g/dL (ref 12.0–15.0)
LYMPHS PCT: 35 % (ref 12–46)
Lymphs Abs: 1.1 10*3/uL (ref 0.7–4.0)
MCH: 30.9 pg (ref 26.0–34.0)
MCHC: 33 g/dL (ref 30.0–36.0)
MCV: 93.7 fL (ref 78.0–100.0)
MONO ABS: 0.3 10*3/uL (ref 0.1–1.0)
Monocytes Relative: 10 % (ref 3–12)
Neutro Abs: 1.7 10*3/uL (ref 1.7–7.7)
Neutrophils Relative %: 51 % (ref 43–77)
Platelets: 271 10*3/uL (ref 150–400)
RBC: 4.3 MIL/uL (ref 3.87–5.11)
RDW: 12.1 % (ref 11.5–15.5)
WBC: 3.3 10*3/uL — ABNORMAL LOW (ref 4.0–10.5)

## 2013-10-17 LAB — URINALYSIS, ROUTINE W REFLEX MICROSCOPIC
Bilirubin Urine: NEGATIVE
GLUCOSE, UA: NEGATIVE mg/dL
HGB URINE DIPSTICK: NEGATIVE
Ketones, ur: NEGATIVE mg/dL
Leukocytes, UA: NEGATIVE
Nitrite: NEGATIVE
PH: 5 (ref 5.0–8.0)
Protein, ur: NEGATIVE mg/dL
SPECIFIC GRAVITY, URINE: 1.024 (ref 1.005–1.030)
Urobilinogen, UA: 0.2 mg/dL (ref 0.0–1.0)

## 2013-10-17 LAB — WET PREP, GENITAL
Clue Cells Wet Prep HPF POC: NONE SEEN
Trich, Wet Prep: NONE SEEN
Yeast Wet Prep HPF POC: NONE SEEN

## 2013-10-17 LAB — POC URINE PREG, ED: Preg Test, Ur: NEGATIVE

## 2013-10-17 MED ORDER — IBUPROFEN 800 MG PO TABS
800.0000 mg | ORAL_TABLET | Freq: Three times a day (TID) | ORAL | Status: DC
Start: 2013-10-17 — End: 2014-08-25

## 2013-10-17 MED ORDER — MORPHINE SULFATE 4 MG/ML IJ SOLN
4.0000 mg | Freq: Once | INTRAMUSCULAR | Status: AC
  Administered 2013-10-17: 4 mg via INTRAVENOUS
  Filled 2013-10-17: qty 1

## 2013-10-17 MED ORDER — SODIUM CHLORIDE 0.9 % IV BOLUS (SEPSIS)
1000.0000 mL | Freq: Once | INTRAVENOUS | Status: AC
  Administered 2013-10-17: 1000 mL via INTRAVENOUS

## 2013-10-17 MED ORDER — POLYETHYLENE GLYCOL 3350 17 G PO PACK: 17.0000 g | PACK | Freq: Every day | ORAL | Status: DC

## 2013-10-17 MED ORDER — ONDANSETRON HCL 4 MG/2ML IJ SOLN
4.0000 mg | Freq: Once | INTRAMUSCULAR | Status: AC
Start: 2013-10-17 — End: 2013-10-17
  Administered 2013-10-17: 4 mg via INTRAVENOUS
  Filled 2013-10-17: qty 2

## 2013-10-17 MED ORDER — ONDANSETRON HCL 4 MG PO TABS: 4.0000 mg | ORAL_TABLET | Freq: Four times a day (QID) | ORAL | Status: DC

## 2013-10-17 NOTE — Discharge Instructions (Signed)
Flank Pain There is no evidence of UTI or kidney stone.  Part of your pain may be from muscle pain or constipation. Take the medications as prescribed and follow up with your doctor. Return to the ED if you develop new or worsening symptoms. Flank pain refers to pain that is located on the side of the body between the upper abdomen and the back. The pain may occur over a short period of time (acute) or may be long-term or reoccurring (chronic). It may be mild or severe. Flank pain can be caused by many things. CAUSES  Some of the more common causes of flank pain include:  Muscle strains.   Muscle spasms.   A disease of your spine (vertebral disk disease).   A lung infection (pneumonia).   Fluid around your lungs (pulmonary edema).   A kidney infection.   Kidney stones.   A very painful skin rash caused by the chickenpox virus (shingles).   Gallbladder disease.  [place] care will depend on the cause of your pain. In general,  Rest as directed by your caregiver.  Drink enough fluids to keep your urine clear or pale yellow.  Only take over-the-counter or prescription medicines as directed by your caregiver. Some medicines may help relieve the pain.  Tell your caregiver about any changes in your pain.  Follow up with your caregiver as directed. SEEK IMMEDIATE MEDICAL CARE IF:   Your pain is not controlled with medicine.   You have new or worsening symptoms.  Your pain increases.   You have abdominal pain.   You have shortness of breath.   You have persistent nausea or vomiting.   You have swelling in your abdomen.   You feel faint or pass out.   You have blood in your urine.  You have a fever or persistent symptoms for more than 2-3 days.  You have a fever and your symptoms suddenly get worse. MAKE SURE YOU:   Understand these instructions.  Will watch your condition.  Will get help right away if you are not doing well  or get worse. Document Released: 05/30/2005 Document Revised: 01/01/2012 Document Reviewed: 11/21/2011 [facility] Patient Information 2015 [place], [place]. This information is not intended to replace advice given to you by your health care provider. Make sure you discuss any questions you have with your health care provider.

## 2013-10-17 NOTE — ED Provider Notes (Signed)
CSN: [id number]     Arrival date & time 10/17/13  0810 History   First MD Initiated Contact with Patient 10/17/13 [phone]     Chief Complaint  Patient presents with  . Flank Pain     (Consider location/radiation/quality/duration/timing/severity/associated sxs/prior Treatment) HPI Comments: 5 day history of right flank pain that started intermittent has become constant for the past 2 days. Right low back radiating to right side of abdomen. Worse with certain movements. Better with lying still. No nausea, vomiting, diarrhea or fever. No dysuria hematuria. No vaginal bleeding or discharge. Last menstrual cycle 2 months ago. Patient states she is not sexually active. Denies any history of kidney stones. No chest pain or shortness of breath. Is not taking anything at home for the pain  The history is provided by the patient.    Past Medical History  Diagnosis Date  . Ectopic pregnancy 2001    treated with methotrexate  . NSVD (normal spontaneous vaginal delivery)   . PROM (premature rupture of membranes)     DELIVERY AT 34 WKS  . Anemia    Past Surgical History  Procedure Laterality Date  . Resectoscopic polypectomy  12/25/2005  . Dilation and curettage of uterus     Family History  Problem Relation Age of Onset  . Hypertension Father   . Heart disease Father     before age 31  . Diabetes Father   . Hyperlipidemia Father   . Heart attack Father   . Hypertension Sister   . Diabetes Paternal Grandmother   . Other Mother     varicose veins  . Cancer Maternal Grandmother     OVARIAN   History  Substance Use Topics  . Smoking status: Never Smoker   . Smokeless tobacco: Never Used  . Alcohol Use: No   OB History   Grav Para Term Preterm Abortions TAB SAB Ect Mult Living   3 2  2 1   1  2      Review of Systems  Constitutional: Negative for activity change and appetite change.  HENT: Negative for congestion and rhinorrhea.   Respiratory: Negative for cough and shortness of  breath.   Cardiovascular: Negative for chest pain.  Gastrointestinal: Negative for nausea, vomiting and abdominal pain.  Genitourinary: Positive for flank pain. Negative for dysuria, hematuria, vaginal bleeding and vaginal discharge.  Musculoskeletal: Positive for back pain. Negative for arthralgias and myalgias.  Skin: Negative for rash.  Neurological: Negative for dizziness and headaches.  A complete 10 system review of systems was obtained and all systems are negative except as noted in the HPI and PMH.      Allergies  Review of patient's allergies indicates no known allergies.  Home Medications   Prior to Admission medications   Medication Sig Start Date End Date Taking? Authorizing Provider  Multiple Vitamins-Minerals (MULTIVITAMIN PO) Take 1 tablet by mouth daily.   Yes Historical Provider, MD  ibuprofen (ADVIL,MOTRIN) 800 MG tablet Take 1 tablet (800 mg total) by mouth 3 (three) times daily. 10/17/13   Ezequiel Essex, MD  ondansetron (ZOFRAN) 4 MG tablet Take 1 tablet (4 mg total) by mouth every 6 (six) hours. 10/17/13   Ezequiel Essex, MD  polyethylene glycol [facility]) packet Take 17 g by mouth daily. 10/17/13   Ezequiel Essex, MD   BP 106/66  Pulse 60  Temp(Src) 98.2 F (36.8 C) (Oral)  Resp 18  SpO2 100%  LMP 08/17/2013 Physical Exam  Nursing note and vitals reviewed. Constitutional: She is  oriented to person, place, and time. She appears well-developed and well-nourished. No distress.  uncomfortable  HENT:  Head: Normocephalic and atraumatic.  Mouth/Throat: Oropharynx is clear and moist. No oropharyngeal exudate.  Eyes: Conjunctivae and EOM are normal. Pupils are equal, round, and reactive to light.  Neck: Normal range of motion. Neck supple.  No meningismus.  Cardiovascular: Normal rate, regular rhythm, normal heart sounds and intact distal pulses.   No murmur heard. Pulmonary/Chest: Effort normal and breath sounds normal. No respiratory distress.  Abdominal:  Soft. There is no tenderness. There is no rebound and no guarding.  No RLQ pain  Genitourinary:  Chaperone present. Normal external genitalia. No CMT. No adnexal tenderness. No discharge  Musculoskeletal: Normal range of motion. She exhibits tenderness. She exhibits no edema.  R CVAT  Neurological: She is alert and oriented to person, place, and time. No cranial nerve deficit. She exhibits normal muscle tone. Coordination normal.  No ataxia on finger to nose bilaterally. No pronator drift. 5/5 strength throughout. CN 2-12 intact. Negative Romberg. Equal grip strength. Sensation intact. Gait is normal.   Skin: Skin is warm.  Psychiatric: She has a normal mood and affect. Her behavior is normal.    ED Course  Procedures (including critical care time) Labs Review Labs Reviewed  WET PREP, GENITAL - Abnormal; Notable for the following:    WBC, Wet Prep HPF POC FEW (*)    All other components within normal limits  CBC WITH DIFFERENTIAL - Abnormal; Notable for the following:    WBC 3.3 (*)    All other components within normal limits  COMPREHENSIVE METABOLIC PANEL - Abnormal; Notable for the following:    GFR calc non Af Amer 76 (*)    GFR calc Af Amer 88 (*)    All other components within normal limits  GC/CHLAMYDIA PROBE AMP  URINALYSIS, ROUTINE W REFLEX MICROSCOPIC  POC URINE PREG, ED    Imaging Review Ct Abdomen Pelvis Wo Contrast  10/17/2013   CLINICAL DATA:  Right flank pain, right lower quadrant pain  EXAM: CT ABDOMEN AND PELVIS WITHOUT CONTRAST  TECHNIQUE: Multidetector CT imaging of the abdomen and pelvis was performed following the standard protocol without IV contrast.  COMPARISON:  01/28/2007  FINDINGS: Lung bases are unremarkable.  Sagittal images of the spine are unremarkable. Unenhanced liver shows no biliary ductal dilatation. No calcified gallstones are noted within gallbladder. Unenhanced pancreas, spleen and adrenal glands are unremarkable. Moderate stool throughout the  colon. No pericecal inflammation. Normal appendix clearly visualized axial image 63. Moderate stool and gas noted in distal sigmoid colon and rectum. Unenhanced uterus and adnexa are unremarkable.  No small bowel obstruction.  No ascites or free air.  No adenopathy.  Bilateral distal ureter is unremarkable. Unenhanced kidneys are symmetrical in size. No nephrolithiasis. No calcified ureteral calculi. No hydronephrosis or hydroureter. No destructive bony lesions are noted within pelvis. Small nonspecific bilateral inguinal lymph nodes.  IMPRESSION: 1. No nephrolithiasis.  No hydronephrosis or hydroureter. 2. No calcified ureteral calculi are noted. 3. Normal appendix.  No pericecal inflammation. 4. Moderate stool throughout the colon.   Electronically Signed   By: Lahoma Crocker M.D.   On: 10/17/2013 10:39     EKG Interpretation None      MDM   Final diagnoses:  Flank pain   Right flank pain without associated symptoms. Suspicious for kidney stone. Will check urine, pregnancy, treat symptoms.  urinalysis is negative. HCG negative. No infection or hematuria.  CT scan does not  show any evidence of kidney stone or other pathology. Appendix is normal. Pelvic exam performed and benign.  Question muscular skeletal flank pain versus recently passed stone. Patient has no hypoxia, tachycardia. Her pain is low in her flanks. Atypical for PE. She is PERC negative.  Tolerating by mouth in ED and ambulatory. Will treat as musculoskeletal flank pain. Followup with PCP. Return precautions discussed.  BP 106/66  Pulse 60  Temp(Src) 98.2 F (36.8 C) (Oral)  Resp 18  SpO2 100%  LMP 08/17/2013   Ezequiel Essex, MD 10/17/13 1550

## 2013-10-17 NOTE — ED Notes (Signed)
Pt c/o intermittent sharp pain to R lower back, side.  Pain became constant last night and began shooting towards R lower abdomen.  Denies nvd.

## 2013-10-18 LAB — GC/CHLAMYDIA PROBE AMP
CT PROBE, AMP APTIMA: NEGATIVE
GC PROBE AMP APTIMA: NEGATIVE

## 2013-12-29 ENCOUNTER — Other Ambulatory Visit: Payer: Self-pay

## 2013-12-29 DIAGNOSIS — Z1231 Encounter for screening mammogram for malignant neoplasm of breast: Secondary | ICD-10-CM

## 2014-02-03 ENCOUNTER — Ambulatory Visit
Admission: RE | Admit: 2014-02-03 | Discharge: 2014-02-03 | Disposition: A | Discharge status: Home / Self Care | Payer: 59 | Source: Ambulatory Visit

## 2014-02-03 DIAGNOSIS — Z1231 Encounter for screening mammogram for malignant neoplasm of breast: Secondary | ICD-10-CM

## 2014-02-05 IMAGING — CR DG CHEST 2V
2 series · 2 of 2 positions shown · non-contrast
Comparison: Coronary CTA 04/20/2012; chest x-ray also 04/20/2012

CLINICAL DATA: Chest pain

CHEST - 2 VIEW

[w chest pa]
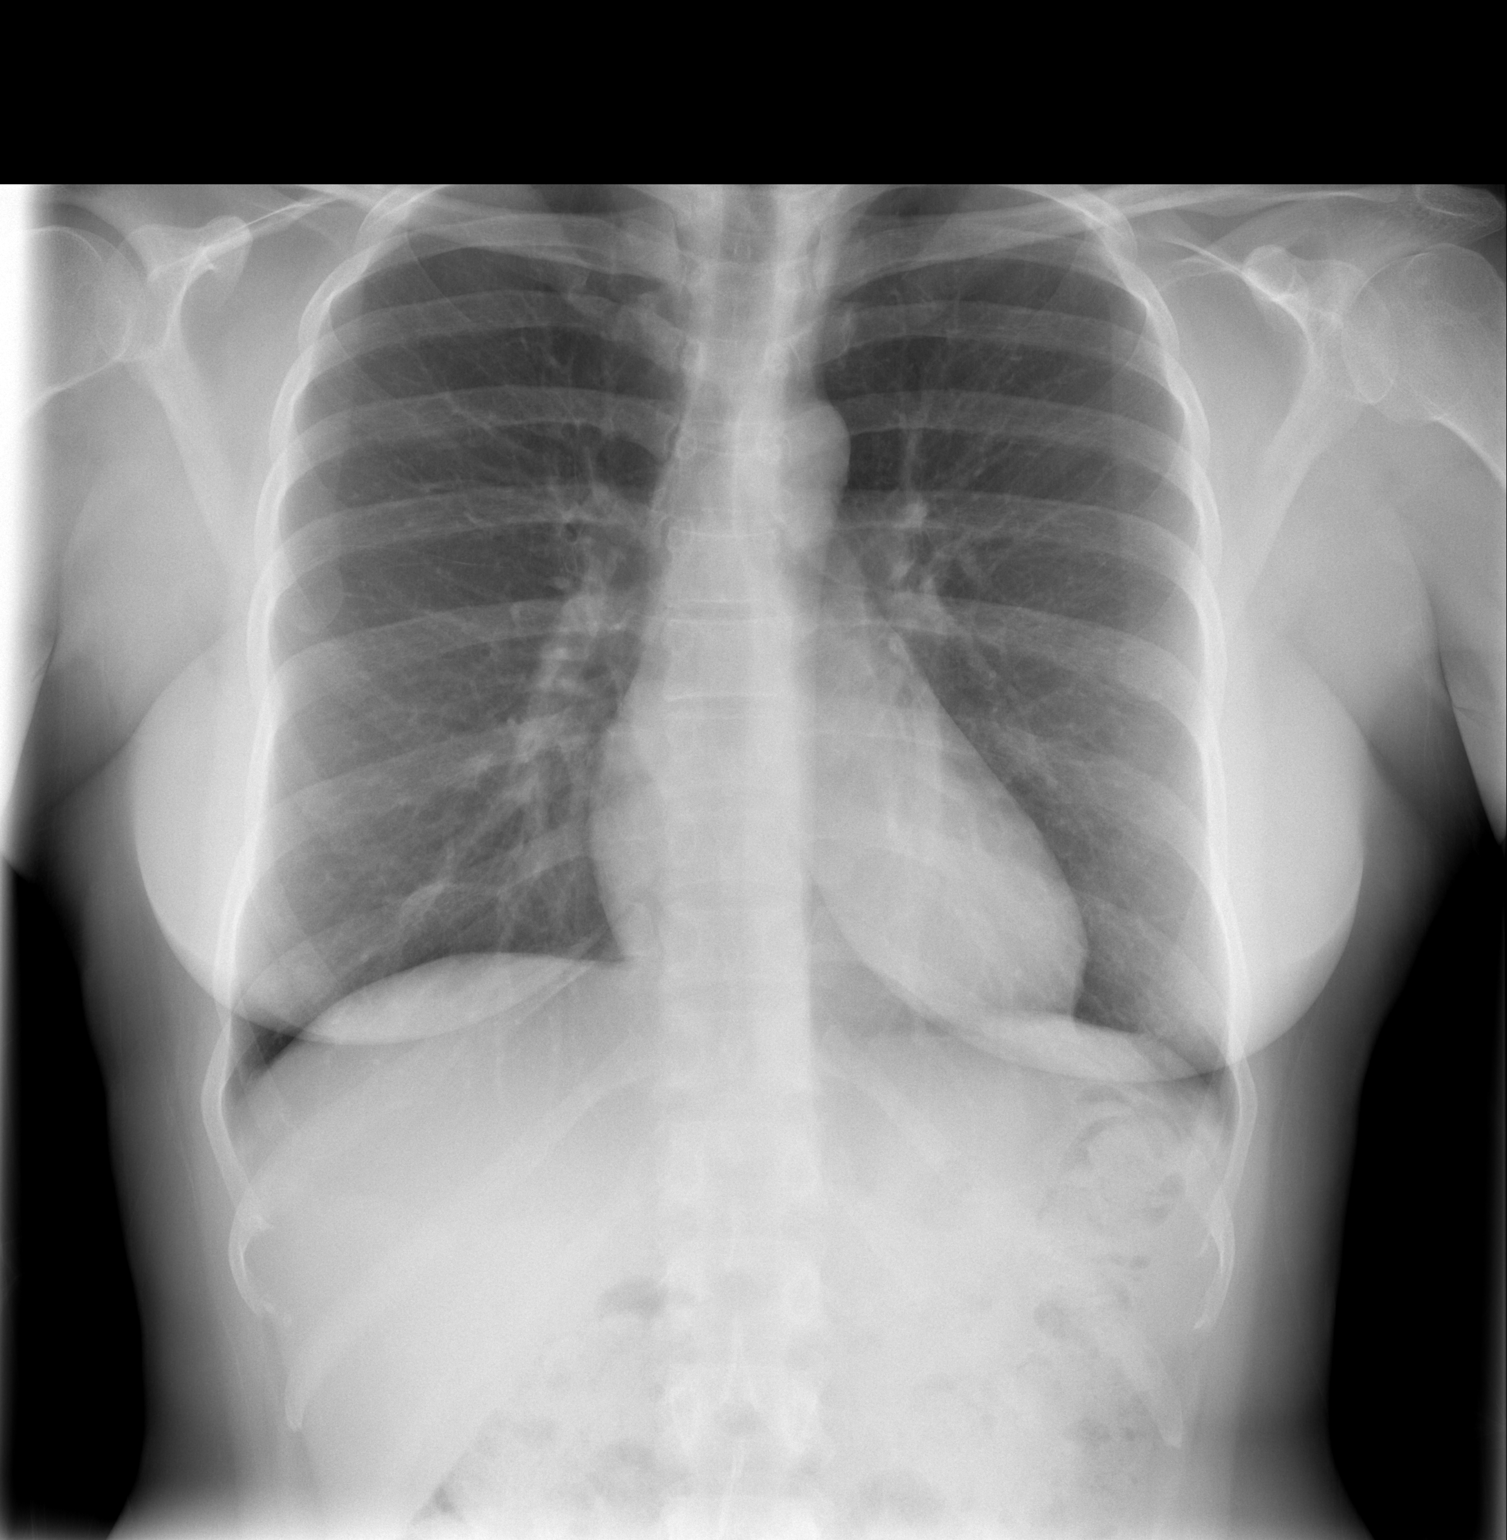

[w chest lat]
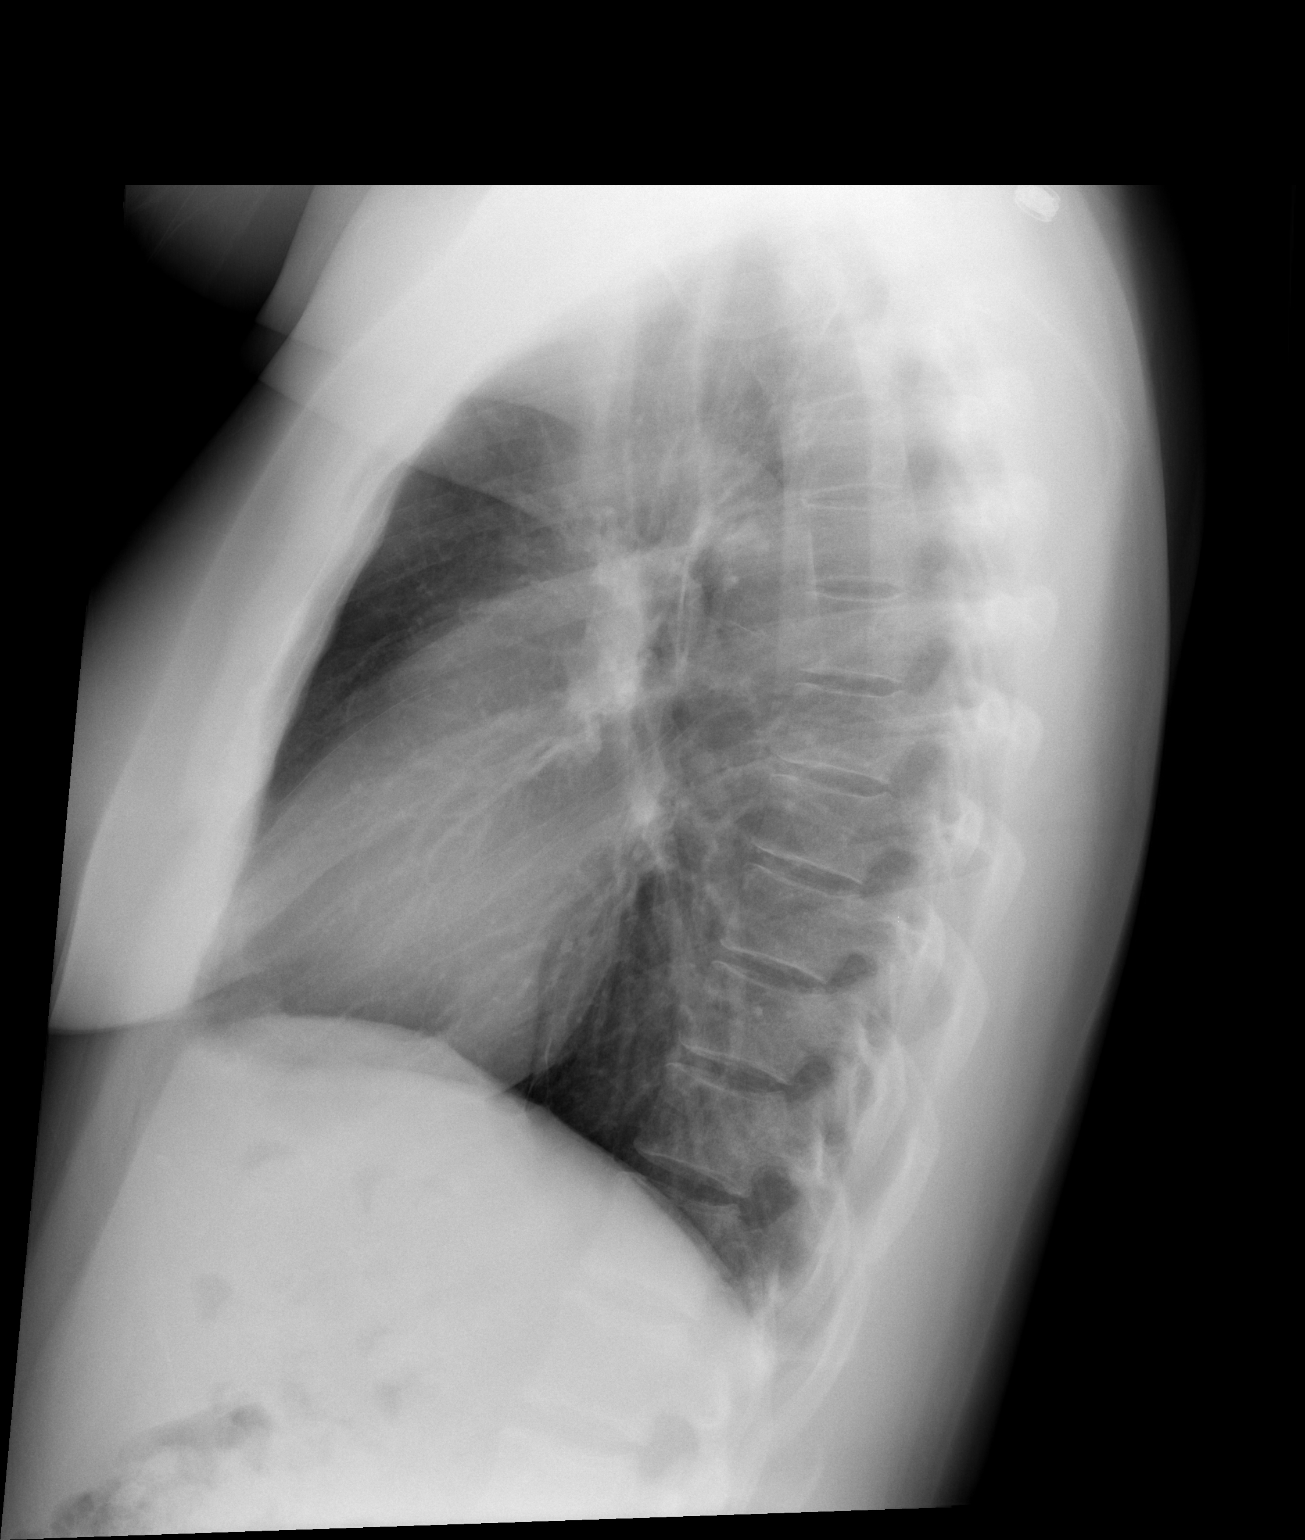

[2 of 2 positions shown; findings below may reference images not displayed]

FINDINGS: [The lungs are well-aerated and free from pulmonary
edema, focal airspace consolidation or pulmonary nodule.  Cardiac
and mediastinal contours are within normal limits.  No
pneumothorax, or pleural effusion. No acute osseous findings.]
IMPRESSION: No acute cardiopulmonary disease.

## 2014-02-21 ENCOUNTER — Encounter (HOSPITAL_COMMUNITY): Payer: Self-pay | Admitting: Emergency Medicine

## 2014-06-18 ENCOUNTER — Encounter (HOSPITAL_COMMUNITY): Payer: Self-pay | Admitting: Emergency Medicine

## 2014-06-18 ENCOUNTER — Emergency Department (HOSPITAL_COMMUNITY): Payer: No Typology Code available for payment source

## 2014-06-18 ENCOUNTER — Emergency Department (HOSPITAL_COMMUNITY)
Admission: EM | Admit: 2014-06-18 | Discharge: 2014-06-18 | Disposition: A | Discharge status: Home / Self Care | Payer: No Typology Code available for payment source | Attending: Emergency Medicine | Admitting: Emergency Medicine

## 2014-06-18 DIAGNOSIS — Z862 Personal history of diseases of the blood and blood-forming organs and certain disorders involving the immune mechanism: Secondary | ICD-10-CM | POA: Diagnosis not present

## 2014-06-18 DIAGNOSIS — Z79899 Other long term (current) drug therapy: Secondary | ICD-10-CM | POA: Insufficient documentation

## 2014-06-18 DIAGNOSIS — Y998 Other external cause status: Secondary | ICD-10-CM | POA: Insufficient documentation

## 2014-06-18 DIAGNOSIS — Y92410 Unspecified street and highway as the place of occurrence of the external cause: Secondary | ICD-10-CM | POA: Insufficient documentation

## 2014-06-18 DIAGNOSIS — Y9389 Activity, other specified: Secondary | ICD-10-CM | POA: Diagnosis not present

## 2014-06-18 DIAGNOSIS — S060X1A Concussion with loss of consciousness of 30 minutes or less, initial encounter: Secondary | ICD-10-CM | POA: Insufficient documentation

## 2014-06-18 DIAGNOSIS — S0990XA Unspecified injury of head, initial encounter: Secondary | ICD-10-CM | POA: Diagnosis present
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
## 2014-06-18 NOTE — Discharge Instructions (Signed)
Concussion A concussion, or closed-head injury, is a brain injury caused by a direct blow to the head or by a quick and sudden movement (jolt) of the head or neck. Concussions are usually not life-threatening. Even so, the effects of a concussion can be serious. If you have had a concussion before, you are more likely to experience concussion-like symptoms after a direct blow to the head.  CAUSES  Direct blow to the head, such as from running into another player during a soccer game, being hit in a fight, or hitting your head on a hard surface.  A jolt of the head or neck that causes the brain to move back and forth inside the skull, such as in a car crash. SIGNS AND SYMPTOMS The signs of a concussion can be hard to notice. Early on, they may be missed by you, family members, and health care providers. You may look fine but act or feel differently. Symptoms are usually temporary, but they may last for days, weeks, or even longer. Some symptoms may appear right away while others may not show up for hours or days. Every head injury is different. Symptoms include:  Mild to moderate headaches that will not go away.  A feeling of pressure inside your head.  Having more trouble than usual:  Learning or remembering things you have heard.  Answering questions.  Paying attention or concentrating.  Organizing daily tasks.  Making decisions and solving problems.  Slowness in thinking, acting or reacting, speaking, or reading.  Getting lost or being easily confused.  Feeling tired all the time or lacking energy (fatigued).  Feeling drowsy.  Sleep disturbances.  Sleeping more than usual.  Sleeping less than usual.  Trouble falling asleep.  Trouble sleeping (insomnia).  Loss of balance or feeling lightheaded or dizzy.  Nausea or vomiting.  Numbness or tingling.  Increased sensitivity to:  Sounds.  Lights.  Distractions.  Vision problems or eyes that tire  easily.  Diminished sense of taste or smell.  Ringing in the ears.  Mood changes such as feeling sad or anxious.  Becoming easily irritated or angry for little or no reason.  Lack of motivation.  Seeing or hearing things other people do not see or hear (hallucinations). DIAGNOSIS Your health care provider can usually diagnose a concussion based on a description of your injury and symptoms. He or she will ask whether you passed out (lost consciousness) and whether you are having trouble remembering events that happened right before and during your injury. Your evaluation might include:  A brain scan to look for signs of injury to the brain. Even if the test shows no injury, you may still have a concussion.  Blood tests to be sure other problems are not present. TREATMENT  Concussions are usually treated in an emergency department, in urgent care, or at a clinic. You may need to stay in the hospital overnight for further treatment.  Tell your health care provider if you are taking any medicines, including prescription medicines, over-the-counter medicines, and natural remedies. Some medicines, such as blood thinners (anticoagulants) and aspirin, may increase the chance of complications. Also tell your health care provider whether you have had alcohol or are taking illegal drugs. This information may affect treatment.  Your health care provider will send you home with important instructions to follow.  How fast you will recover from a concussion depends on many factors. These factors include how severe your concussion is, what part of your brain was injured, your  age, and how healthy you were before the concussion.  Most people with mild injuries recover fully. Recovery can take time. In general, recovery is slower in older persons. Also, persons who have had a concussion in the past or have other medical problems may find that it takes longer to recover from their current injury. HOME  CARE INSTRUCTIONS General Instructions  Carefully follow the directions your health care provider gave you.  Only take over-the-counter or prescription medicines for pain, discomfort, or fever as directed by your health care provider.  Take only those medicines that your health care provider has approved.  Do not drink alcohol until your health care provider says you are well enough to do so. Alcohol and certain other drugs may slow your recovery and can put you at risk of further injury.  If it is harder than usual to remember things, write them down.  If you are easily distracted, try to do one thing at a time. For example, do not try to watch TV while fixing dinner.  Talk with family members or close friends when making important decisions.  Keep all follow-up appointments. Repeated evaluation of your symptoms is recommended for your recovery.  Watch your symptoms and tell others to do the same. Complications sometimes occur after a concussion. Older adults with a brain injury may have a higher risk of serious complications, such as a blood clot on the brain.  Tell your teachers, school nurse, school counselor, coach, athletic trainer, or work Freight forwarder about your injury, symptoms, and restrictions. Tell them about what you can or cannot do. They should watch for:  Increased problems with attention or concentration.  Increased difficulty remembering or learning new information.  Increased time needed to complete tasks or assignments.  Increased irritability or decreased ability to cope with stress.  Increased symptoms.  Rest. Rest helps the brain to heal. Make sure you:  Get plenty of sleep at night. Avoid staying up late at night.  Keep the same bedtime hours on weekends and weekdays.  Rest during the day. Take daytime naps or rest breaks when you feel tired.  Limit activities that require a lot of thought or concentration. These include:  Doing homework or job-related  work.  Watching TV.  Working on the computer.  Avoid any situation where there is potential for another head injury (football, hockey, soccer, basketball, martial arts, downhill snow sports and horseback riding). Your condition will get worse every time you experience a concussion. You should avoid these activities until you are evaluated by the appropriate follow-up health care providers. Returning To Your Regular Activities You will need to return to your normal activities slowly, not all at once. You must give your body and brain enough time for recovery.  Do not return to sports or other athletic activities until your health care provider tells you it is safe to do so.  Ask your health care provider when you can drive, ride a bicycle, or operate heavy machinery. Your ability to react may be slower after a brain injury. Never do these activities if you are dizzy.  Ask your health care provider about when you can return to work or school. Preventing Another Concussion It is very important to avoid another brain injury, especially before you have recovered. In rare cases, another injury can lead to permanent brain damage, brain swelling, or death. The risk of this is greatest during the first 7-10 days after a head injury. Avoid injuries by:  Wearing a seat  belt when riding in a car.  Drinking alcohol only in moderation.  Wearing a helmet when biking, skiing, skateboarding, skating, or doing similar activities.  Avoiding activities that could lead to a second concussion, such as contact or recreational sports, until your health care provider says it is okay.  Taking safety measures in your home.  Remove clutter and tripping hazards from floors and stairways.  Use grab bars in bathrooms and handrails by stairs.  Place non-slip mats on floors and in bathtubs.  Improve lighting in dim areas. SEEK MEDICAL CARE IF:  You have increased problems paying attention or  concentrating.  You have increased difficulty remembering or learning new information.  You need more time to complete tasks or assignments than before.  You have increased irritability or decreased ability to cope with stress.  You have more symptoms than before. Seek medical care if you have any of the following symptoms for more than 2 weeks after your injury:  Lasting (chronic) headaches.  Dizziness or balance problems.  Nausea.  Vision problems.  Increased sensitivity to noise or light.  Depression or mood swings.  Anxiety or irritability.  Memory problems.  Difficulty concentrating or paying attention.  Sleep problems.  Feeling tired all the time. SEEK IMMEDIATE MEDICAL CARE IF:  You have severe or worsening headaches. These may be a sign of a blood clot in the brain.  You have weakness (even if only in one hand, leg, or part of the face).  You have numbness.  You have decreased coordination.  You vomit repeatedly.  You have increased sleepiness.  One pupil is larger than the other.  You have convulsions.  You have slurred speech.  You have increased confusion. This may be a sign of a blood clot in the brain.  You have increased restlessness, agitation, or irritability.  You are unable to recognize people or places.  You have neck pain.  It is difficult to wake you up.  You have unusual behavior changes.  You lose consciousness. MAKE SURE YOU:  Understand these instructions.  Will watch your condition.  Will get help right away if you are not doing well or get worse. Document Released: 06/29/2003 Document Revised: 04/13/2013 Document Reviewed: 10/29/2012 [facility] Patient Information 2015 [place], [place]. This information is not intended to replace advice given to you by your health care provider. Make sure you discuss any questions you have with your health care provider.  Motor Vehicle Collision It is common to have multiple bruises  and sore muscles after a motor vehicle collision (MVC). These tend to feel worse for the first 24 hours. You may have the most stiffness and soreness over the first several hours. You may also feel worse when you wake up the first morning after your collision. After this point, you will usually begin to improve with each day. The speed of improvement often depends on the severity of the collision, the number of injuries, and the location and nature of these injuries. HOME CARE INSTRUCTIONS  Put ice on the injured area.  Put ice in a plastic bag.  Place a towel between your skin and the bag.  Leave the ice on for 15-20 minutes, 3-4 times a day, or as directed by your health care provider.  Drink enough fluids to keep your urine clear or pale yellow. Do not drink alcohol.  Take a warm shower or bath once or twice a day. This will increase blood flow to sore muscles.  You may return to  activities as directed by your caregiver. Be careful when lifting, as this may aggravate neck or back pain.  Only take over-the-counter or prescription medicines for pain, discomfort, or fever as directed by your caregiver. Do not use aspirin. This may increase bruising and bleeding. SEEK IMMEDIATE MEDICAL CARE IF:  You have numbness, tingling, or weakness in the arms or legs.  You develop severe headaches not relieved with medicine.  You have severe neck pain, especially tenderness in the middle of the back of your neck.  You have changes in bowel or bladder control.  There is increasing pain in any area of the body.  You have shortness of breath, light-headedness, dizziness, or fainting.  You have chest pain.  You feel sick to your stomach (nauseous), throw up (vomit), or sweat.  You have increasing abdominal discomfort.  There is blood in your urine, stool, or vomit.  You have pain in your shoulder (shoulder strap areas).  You feel your symptoms are getting worse. MAKE SURE YOU:  Understand  these instructions.  Will watch your condition.  Will get help right away if you are not doing well or get worse. Document Released: 04/08/2005 Document Revised: 08/23/2013 Document Reviewed: 09/05/2010 [facility] Patient Information 2015 [place], [place]. This information is not intended to replace advice given to you by your health care provider. Make sure you discuss any questions you have with your health care provider.

## 2014-06-18 NOTE — ED Notes (Signed)
Pt A&Ox4, NAD noted. Pt denies pain. Pt ambulatory on discharge. Pt refuses wheelchair

## 2014-08-25 ENCOUNTER — Ambulatory Visit (INDEPENDENT_AMBULATORY_CARE_PROVIDER_SITE_OTHER): Payer: 59 | Admitting: Gynecology

## 2014-08-25 ENCOUNTER — Encounter: Payer: Self-pay | Admitting: Gynecology

## 2014-08-25 VITALS — BP 126/80 | Ht 64.0 in | Wt 162.0 lb

## 2014-08-25 DIAGNOSIS — N951 Menopausal and female climacteric states: Secondary | ICD-10-CM | POA: Insufficient documentation

## 2014-08-25 DIAGNOSIS — N912 Amenorrhea, unspecified: Secondary | ICD-10-CM

## 2014-08-25 DIAGNOSIS — Z01419 Encounter for gynecological examination (general) (routine) without abnormal findings: Secondary | ICD-10-CM

## 2014-08-25 LAB — LIPID PANEL
Cholesterol: 199 mg/dL (ref 0–200)
HDL: 59 mg/dL (ref >=46)
LDL CALC: 120 mg/dL — AB (ref 0–99)
TRIGLYCERIDES: 100 mg/dL (ref <150)
Total CHOL/HDL Ratio: 3.4 Ratio
VLDL: 20 mg/dL (ref 0–40)

## 2014-08-25 LAB — COMPREHENSIVE METABOLIC PANEL
ALBUMIN: 4.3 g/dL (ref 3.5–5.2)
ALT: 17 U/L (ref 0–35)
AST: 18 U/L (ref 0–37)
Alkaline Phosphatase: 59 U/L (ref 39–117)
BUN: 18 mg/dL (ref 6–23)
CO2: 27 meq/L (ref 19–32)
Calcium: 9.3 mg/dL (ref 8.4–10.5)
Chloride: 103 mEq/L (ref 96–112)
Creat: 0.91 mg/dL (ref 0.50–1.10)
Glucose, Bld: 89 mg/dL (ref 70–99)
Potassium: 5 mEq/L (ref 3.5–5.3)
Sodium: 139 mEq/L (ref 135–145)
TOTAL PROTEIN: 7 g/dL (ref 6.0–8.3)
Total Bilirubin: 0.6 mg/dL (ref 0.2–1.2)

## 2014-08-25 LAB — CBC WITH DIFFERENTIAL/PLATELET
BASOS PCT: 0 % (ref 0–1)
Basophils Absolute: 0 10*3/uL (ref 0.0–0.1)
Eosinophils Absolute: 0.1 10*3/uL (ref 0.0–0.7)
Eosinophils Relative: 2 % (ref 0–5)
HCT: 39.5 % (ref 36.0–46.0)
HEMOGLOBIN: 12.8 g/dL (ref 12.0–15.0)
LYMPHS ABS: 1 10*3/uL (ref 0.7–4.0)
Lymphocytes Relative: 22 % (ref 12–46)
MCH: 30 pg (ref 26.0–34.0)
MCHC: 32.4 g/dL (ref 30.0–36.0)
MCV: 92.7 fL (ref 78.0–100.0)
MONOS PCT: 10 % (ref 3–12)
MPV: 9.3 fL (ref 8.6–12.4)
Monocytes Absolute: 0.5 10*3/uL (ref 0.1–1.0)
NEUTROS ABS: 3.1 10*3/uL (ref 1.7–7.7)
Neutrophils Relative %: 66 % (ref 43–77)
Platelets: 303 10*3/uL (ref 150–400)
RBC: 4.26 MIL/uL (ref 3.87–5.11)
RDW: 12.9 % (ref 11.5–15.5)
WBC: 4.7 10*3/uL (ref 4.0–10.5)

## 2014-08-25 LAB — TSH: TSH: 0.022 u[IU]/mL — ABNORMAL LOW (ref 0.350–4.500)

## 2014-08-25 NOTE — Patient Instructions (Signed)
Hormone Therapy At menopause, your body begins making less estrogen and progesterone hormones. This causes the body to stop having menstrual periods. This is because estrogen and progesterone hormones control your periods and menstrual cycle. A lack of estrogen may cause symptoms such as:  Hot flushes (or hot flashes).  Vaginal dryness.  Dry skin.  Loss of sex drive.  Risk of bone loss (osteoporosis). When this happens, you may choose to take hormone therapy to get back the estrogen lost during menopause. When the hormone estrogen is given alone, it is usually referred to as ET (Estrogen Therapy). When the hormone progestin is combined with estrogen, it is generally called HT (Hormone Therapy). This was formerly known as hormone replacement therapy (HRT). Your caregiver can help you make a decision on what will be best for you. The decision to use HT seems to change often as new studies are done. Many studies do not agree on the benefits of hormone replacement therapy. LIKELY BENEFITS OF HT INCLUDE PROTECTION FROM:  Hot Flushes (also called hot flashes) - A hot flush is a sudden feeling of heat that spreads over the face and body. The skin may redden like a blush. It is connected with sweats and sleep disturbance. Women going through menopause may have hot flushes a few times a month or several times per day depending on the woman.  Osteoporosis (bone loss)- Estrogen helps guard against bone loss. After menopause, a woman's bones slowly lose calcium and become weak and brittle. As a result, bones are more likely to break. The hip, wrist, and spine are affected most often. Hormone therapy can help slow bone loss after menopause. Weight bearing exercise and taking calcium with vitamin D also can help prevent bone loss. There are also medications that your caregiver can prescribe that can help prevent osteoporosis.  Vaginal Dryness - Loss of estrogen causes changes in the vagina. Its lining may  become thin and dry. These changes can cause pain and bleeding during sexual intercourse. Dryness can also lead to infections. This can cause burning and itching. (Vaginal estrogen treatment can help relieve pain, itching, and dryness.)  Urinary Tract Infections are more common after menopause because of lack of estrogen. Some women also develop urinary incontinence because of low estrogen levels in the vagina and bladder.  Possible other benefits of estrogen include a positive effect on mood and short-term memory in women. RISKS AND COMPLICATIONS  Using estrogen alone without progesterone causes the lining of the uterus to grow. This increases the risk of lining of the uterus (endometrial) cancer. Your caregiver should give another hormone called progestin if you have a uterus.  Women who take combined (estrogen and progestin) HT appear to have an increased risk of breast cancer. The risk appears to be small, but increases throughout the time that HT is taken.  Combined therapy also makes the breast tissue slightly denser which makes it harder to read mammograms (breast X-rays).  Combined, estrogen and progesterone therapy can be taken together every day, in which case there may be spotting of blood. HT therapy can be taken cyclically in which case you will have menstrual periods. Cyclically means HT is taken for a set amount of days, then not taken, then this process is repeated.  HT may increase the risk of stroke, heart attack, breast cancer and forming blood clots in your leg.  Transdermal estrogen (estrogen that is absorbed through the skin with a patch or a cream) may have more positive results with:    Cholesterol.  Blood pressure.  Blood clots. Having the following conditions may indicate you should not have HT:  Endometrial cancer.  Liver disease.  Breast cancer.  Heart disease.  History of blood clots.  Stroke. TREATMENT   If you choose to take HT and have a uterus,  usually estrogen and progestin are prescribed.  Your caregiver will help you decide the best way to take the medications.  Possible ways to take estrogen include:  Pills.  Patches.  Gels.  Sprays.  Vaginal estrogen cream, rings and tablets.  It is best to take the lowest dose possible that will help your symptoms and take them for the shortest period of time that you can.  Hormone therapy can help relieve some of the problems (symptoms) that affect women at menopause. Before making a decision about HT, talk to your caregiver about what is best for you. Be well informed and comfortable with your decisions. HOME CARE INSTRUCTIONS   Follow your caregivers advice when taking the medications.  A Pap test is done to screen for cervical cancer.  The first Pap test should be done at age 21.  Between ages 21 and 29, Pap tests are repeated every 2 years.  Beginning at age 30, you are advised to have a Pap test every 3 years as long as your past 3 Pap tests have been normal.  Some women have medical problems that increase the chance of getting cervical cancer. Talk to your caregiver about these problems. It is especially important to talk to your caregiver if a new problem develops soon after your last Pap test. In these cases, your caregiver may recommend more frequent screening and Pap tests.  The above recommendations are the same for women who have or have not gotten the vaccine for HPV (Human Papillomavirus).  If you had a hysterectomy for a problem that was not a cancer or a condition that could lead to cancer, then you no longer need Pap tests. However, even if you no longer need a Pap test, a regular exam is a good idea to make sure no other problems are starting.   If you are between ages 65 and 70, and you have had normal Pap tests going back 10 years, you no longer need Pap tests. However, even if you no longer need a Pap test, a regular exam is a good idea to make sure no  other problems are starting.   If you have had past treatment for cervical cancer or a condition that could lead to cancer, you need Pap tests and screening for cancer for at least 20 years after your treatment.  If Pap tests have been discontinued, risk factors (such as a new sexual partner) need to be re-assessed to determine if screening should be resumed.  Some women may need screenings more often if they are at high risk for cervical cancer.  Get mammograms done as per the advice of your caregiver. SEEK IMMEDIATE MEDICAL CARE IF:  You develop abnormal vaginal bleeding.  You have pain or swelling in your legs, shortness of breath, or chest pain.  You develop dizziness or headaches.  You have lumps or changes in your breasts or armpits.  You have slurred speech.  You develop weakness or numbness of your arms or legs.  You have pain, burning, or bleeding when urinating.  You develop abdominal pain. Document Released: 01/05/2003 Document Revised: 07/01/2011 Document Reviewed: 04/25/2010 [employer] Patient Information 2015 [employer]. This information is not intended to   replace advice given to you by your health care provider. Make sure you discuss any questions you have with your health care provider. Perimenopause Perimenopause is the time when your body begins to move into the menopause (no menstrual period for 12 straight months). It is a natural process. Perimenopause can begin 2-8 years before the menopause and usually lasts for 1 year after the menopause. During this time, your ovaries may or may not produce an egg. The ovaries vary in their production of estrogen and progesterone hormones each month. This can cause irregular menstrual periods, difficulty getting pregnant, vaginal bleeding between periods, and uncomfortable symptoms. CAUSES  Irregular production of the ovarian hormones, estrogen and progesterone, and not ovulating every month.  Other causes  include:  Tumor of the pituitary gland in the brain.  Medical disease that affects the ovaries.  Radiation treatment.  Chemotherapy.  Unknown causes.  Heavy smoking and excessive alcohol intake can bring on perimenopause sooner. SIGNS AND SYMPTOMS   Hot flashes.  Night sweats.  Irregular menstrual periods.  Decreased sex drive.  Vaginal dryness.  Headaches.  Mood swings.  Depression.  Memory problems.  Irritability.  Tiredness.  Weight gain.  Trouble getting pregnant.  The beginning of losing bone cells (osteoporosis).  The beginning of hardening of the arteries (atherosclerosis). DIAGNOSIS  Your health care provider will make a diagnosis by analyzing your age, menstrual history, and symptoms. He or she will do a physical exam and note any changes in your body, especially your female organs. Female hormone tests may or may not be helpful depending on the amount of female hormones you produce and when you produce them. However, other hormone tests may be helpful to rule out other problems. TREATMENT  In some cases, no treatment is needed. The decision on whether treatment is necessary during the perimenopause should be made by you and your health care provider based on how the symptoms are affecting you and your lifestyle. Various treatments are available, such as:  Treating individual symptoms with a specific medicine for that symptom.  Herbal medicines that can help specific symptoms.  Counseling.  Group therapy. HOME CARE INSTRUCTIONS   Keep track of your menstrual periods (when they occur, how heavy they are, how long between periods, and how long they last) as well as your symptoms and when they started.  Only take over-the-counter or prescription medicines as directed by your health care provider.  Sleep and rest.  Exercise.  Eat a diet that contains calcium (good for your bones) and soy (acts like the estrogen hormone).  Do not smoke.  Avoid  alcoholic beverages.  Take vitamin supplements as recommended by your health care provider. Taking vitamin E may help in certain cases.  Take calcium and vitamin D supplements to help prevent bone loss.  Group therapy is sometimes helpful.  Acupuncture may help in some cases. SEEK MEDICAL CARE IF:   You have questions about any symptoms you are having.  You need a referral to a specialist (gynecologist, psychiatrist, or psychologist). SEEK IMMEDIATE MEDICAL CARE IF:   You have vaginal bleeding.  Your period lasts longer than 8 days.  Your periods are recurring sooner than 21 days.  You have bleeding after intercourse.  You have severe depression.  You have pain when you urinate.  You have severe headaches.  You have vision problems. Document Released: 05/16/2004 Document Revised: 01/27/2013 Document Reviewed: 11/05/2012 [facility] Patient Information 2015 [place], [place]. This information is not intended to replace advice given to  to you by your health care provider. Make sure you discuss any questions you have with your health care provider.  

## 2014-08-25 NOTE — Progress Notes (Signed)
Kimberly Vargas [birth year] [id number]   History:    44 y.o.  for annual gyn exam who states that she has not had a menstrual cycle since last year. In 2015 her [place] was found to be elevated at 41.1. She describes minimal if any vasomotor symptoms. Patient not sexually active. No using any form of contraception. Patient with no past history of any abnormal Pap smears.  Past medical history,surgical history, family history and social history were all reviewed and documented in the EPIC chart.  Gynecologic History Patient's last menstrual period was 08/17/2013. Contraception: none Last Pap: 2015. Results were: normal Last mammogram: 2015. Results were: normal  Obstetric History OB History  Gravida Para Term Preterm AB SAB TAB Ectopic Multiple Living  3 2  2 1   1  2     # Outcome Date GA Lbr Len/2nd Weight Sex Delivery Anes PTL Lv  3 Ectopic           2 Preterm           1 Preterm                ROS: A ROS was performed and pertinent positives and negatives are included in the history.  GENERAL: No fevers or chills. HEENT: No change in vision, no earache, sore throat or sinus congestion. NECK: No pain or stiffness. CARDIOVASCULAR: No chest pain or pressure. No palpitations. PULMONARY: No shortness of breath, cough or wheeze. GASTROINTESTINAL: No abdominal pain, nausea, vomiting or diarrhea, melena or bright red blood per rectum. GENITOURINARY: No urinary frequency, urgency, hesitancy or dysuria. MUSCULOSKELETAL: No joint or muscle pain, no back pain, no recent trauma. DERMATOLOGIC: No rash, no itching, no lesions. ENDOCRINE: No polyuria, polydipsia, no heat or cold intolerance. No recent change in weight. HEMATOLOGICAL: No anemia or easy bruising or bleeding. NEUROLOGIC: No headache, seizures, numbness, tingling or weakness. PSYCHIATRIC: No depression, no loss of interest in normal activity or change in sleep pattern.     Exam: chaperone present  BP 126/80 mmHg  Ht 5\' 4"  (1.626 m)   Wt 162 lb (73.483 kg)  BMI 27.79 kg/m2  LMP 08/17/2013  Body mass index is 27.79 kg/(m^2).  General appearance : Well developed well nourished female. No acute distress HEENT: Eyes: no retinal hemorrhage or exudates,  Neck supple, trachea midline, no carotid bruits, no thyroidmegaly Lungs: Clear to auscultation, no rhonchi or wheezes, or rib retractions  Heart: Regular rate and rhythm, no murmurs or gallops Breast:Examined in sitting and supine position were symmetrical in appearance, no palpable masses or tenderness,  no skin retraction, no nipple inversion, no nipple discharge, no skin discoloration, no axillary or supraclavicular lymphadenopathy Abdomen: no palpable masses or tenderness, no rebound or guarding Extremities: no edema or skin discoloration or tenderness  Pelvic:  Bartholin, Urethra, Skene Glands: Within normal limits             Vagina: No gross lesions or discharge  Cervix: No gross lesions or discharge  Uterus  anteverted, normal size, shape and consistency, non-tender and mobile  Adnexa  Without masses or tenderness  Anus and perineum  normal   Rectovaginal  normal sphincter tone without palpated masses or tenderness             Hemoccult not indicated     Assessment/Plan:  44 y.o. female for annual exam with premature ovarian failure with minimal vasomotor symptoms not interested in normal replacement therapy. His peppermint oil to apply to her skin twice a  day to help with some of her minimal symptoms. I provided with literature information on the menopause as well as on hormone replacement therapy. We'll repeat her [facility] today along with the following screening labs: TSH, comprehensive metabolic panel, lipid profile, CBC and urinalysis. Pap smear was not done today in accordance to the new guidelines. Patient have her mammogram at the end of the year.   Terrance Mass MD, 12:10 PM 08/25/2014

## 2014-08-26 ENCOUNTER — Other Ambulatory Visit: Payer: Self-pay | Admitting: Gynecology

## 2014-08-26 DIAGNOSIS — R7989 Other specified abnormal findings of blood chemistry: Secondary | ICD-10-CM

## 2014-08-26 LAB — URINALYSIS W MICROSCOPIC + REFLEX CULTURE
Bacteria, UA: NONE SEEN
Bilirubin Urine: NEGATIVE
CASTS: NONE SEEN
CRYSTALS: NONE SEEN
Glucose, UA: NEGATIVE mg/dL
Hgb urine dipstick: NEGATIVE
KETONES UR: NEGATIVE mg/dL
Leukocytes, UA: NEGATIVE
Nitrite: NEGATIVE
PROTEIN: NEGATIVE mg/dL
Specific Gravity, Urine: 1.023 (ref 1.005–1.030)
Squamous Epithelial / LPF: NONE SEEN
Urobilinogen, UA: 0.2 mg/dL (ref 0.0–1.0)
pH: 6 (ref 5.0–8.0)

## 2014-08-26 LAB — FOLLICLE STIMULATING HORMONE: FSH: 110.1 m[IU]/mL

## 2014-08-30 ENCOUNTER — Other Ambulatory Visit: Payer: 59

## 2014-08-30 DIAGNOSIS — R7989 Other specified abnormal findings of blood chemistry: Secondary | ICD-10-CM

## 2014-08-30 LAB — THYROID PANEL WITH TSH
Free Thyroxine Index: 2.2 (ref 1.4–3.8)
T3 UPTAKE: 28 % (ref 22–35)
T4, Total: 8 ug/dL (ref 4.5–12.0)
TSH: 0.036 u[IU]/mL — ABNORMAL LOW (ref 0.350–4.500)

## 2014-08-31 ENCOUNTER — Telehealth: Payer: Self-pay | Admitting: *Deleted

## 2014-08-31 DIAGNOSIS — R946 Abnormal results of thyroid function studies: Secondary | ICD-10-CM

## 2014-08-31 NOTE — Telephone Encounter (Signed)
Referral placed they will contact pt to schedule. 

## 2014-08-31 NOTE — Telephone Encounter (Signed)
-----   Message from Ramond Craver, [place] sent at 08/31/2014  2:26 PM EDT ----- Regarding: referral to Dr. Cruzita Lederer Please inform patient that her thyroid function test is still some abnormal but that I would like to refer her to the endocrinologist Dr. Cruzita Lederer with [facility] for further evaluation. Please make an appointment for her.  I spoke with patient about results. She knows she will hear from someone with appt date/ time

## 2014-09-02 NOTE — Telephone Encounter (Signed)
appointment on 10/28/14 @ 11:15am

## 2014-10-28 ENCOUNTER — Encounter: Payer: Self-pay | Admitting: Internal Medicine

## 2014-10-28 ENCOUNTER — Ambulatory Visit (INDEPENDENT_AMBULATORY_CARE_PROVIDER_SITE_OTHER): Payer: 59 | Admitting: Internal Medicine

## 2014-10-28 VITALS — BP 112/68 | HR 69 | Temp 98.0°F | Resp 12 | Ht 64.5 in | Wt 161.8 lb

## 2014-10-28 DIAGNOSIS — R946 Abnormal results of thyroid function studies: Secondary | ICD-10-CM

## 2014-10-28 DIAGNOSIS — R7989 Other specified abnormal findings of blood chemistry: Secondary | ICD-10-CM
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
## 2014-10-28 NOTE — Patient Instructions (Signed)
Please come back for labs in 1 week, off the Prednisone and off multivitamins.  Let's schedule another appt based on what the results are.

## 2014-12-09 ENCOUNTER — Other Ambulatory Visit (INDEPENDENT_AMBULATORY_CARE_PROVIDER_SITE_OTHER): Payer: 59

## 2014-12-09 DIAGNOSIS — R7989 Other specified abnormal findings of blood chemistry: Secondary | ICD-10-CM

## 2014-12-09 DIAGNOSIS — R946 Abnormal results of thyroid function studies: Secondary | ICD-10-CM

## 2014-12-09 LAB — TSH: TSH: 1.03 u[IU]/mL (ref 0.35–4.50)

## 2014-12-09 LAB — T4, FREE: FREE T4: 0.62 ng/dL (ref 0.60–1.60)

## 2014-12-09 LAB — T3, FREE: T3, Free: 3.1 pg/mL (ref 2.3–4.2)

## 2014-12-12 ENCOUNTER — Other Ambulatory Visit: Payer: Self-pay | Admitting: Internal Medicine

## 2014-12-12 DIAGNOSIS — R7989 Other specified abnormal findings of blood chemistry: Secondary | ICD-10-CM

## 2014-12-14 LAB — THYROID STIMULATING IMMUNOGLOBULIN: TSI: 185 % baseline — ABNORMAL HIGH (ref <140)

## 2015-01-16 ENCOUNTER — Other Ambulatory Visit: Payer: Self-pay

## 2015-01-16 DIAGNOSIS — Z1231 Encounter for screening mammogram for malignant neoplasm of breast: Secondary | ICD-10-CM

## 2015-02-09 ENCOUNTER — Ambulatory Visit
Admission: RE | Admit: 2015-02-09 | Discharge: 2015-02-09 | Disposition: A | Discharge status: Home / Self Care | Payer: 59 | Source: Ambulatory Visit

## 2015-02-09 DIAGNOSIS — Z1231 Encounter for screening mammogram for malignant neoplasm of breast: Secondary | ICD-10-CM

## 2015-06-19 ENCOUNTER — Encounter: Payer: Self-pay | Admitting: Gynecology

## 2015-11-19 ENCOUNTER — Emergency Department (HOSPITAL_COMMUNITY): Payer: 59

## 2015-11-19 ENCOUNTER — Encounter (HOSPITAL_COMMUNITY): Payer: Self-pay

## 2015-11-19 ENCOUNTER — Emergency Department (HOSPITAL_COMMUNITY)
Admission: EM | Admit: 2015-11-19 | Discharge: 2015-11-19 | Disposition: A | Discharge status: Home / Self Care | Payer: 59 | Attending: Emergency Medicine | Admitting: Emergency Medicine

## 2015-11-19 DIAGNOSIS — Z79899 Other long term (current) drug therapy: Secondary | ICD-10-CM | POA: Insufficient documentation

## 2015-11-19 DIAGNOSIS — Y9289 Other specified places as the place of occurrence of the external cause: Secondary | ICD-10-CM | POA: Diagnosis not present

## 2015-11-19 DIAGNOSIS — M79675 Pain in left toe(s): Secondary | ICD-10-CM

## 2015-11-19 DIAGNOSIS — Y9301 Activity, walking, marching and hiking: Secondary | ICD-10-CM | POA: Insufficient documentation

## 2015-11-19 DIAGNOSIS — X503XXA Overexertion from repetitive movements, initial encounter: Secondary | ICD-10-CM | POA: Diagnosis not present

## 2015-11-19 DIAGNOSIS — Y990 Civilian activity done for income or pay: Secondary | ICD-10-CM | POA: Diagnosis not present

## 2015-11-19 MED ORDER — INDOMETHACIN 50 MG PO CAPS: 50.0000 mg | ORAL_CAPSULE | Freq: Three times a day (TID) | ORAL | 0 refills | Status: DC

## 2015-11-19 MED ORDER — INDOMETHACIN 25 MG PO CAPS
50.0000 mg | ORAL_CAPSULE | Freq: Once | ORAL | Status: AC
  Administered 2015-11-19: 50 mg via ORAL
  Filled 2015-11-19: qty 2

## 2015-11-19 NOTE — ED Notes (Signed)
Patient transported to X-ray 

## 2015-11-19 NOTE — ED Triage Notes (Signed)
Pt complaining of L medial foot pain x 3 days. Denies any fall, injury or trauma. States is on feet at work often.

## 2015-11-19 NOTE — Discharge Instructions (Signed)
Take the indomethacin as prescribed. Follow-up with your primary care provider within 2 days to have your foot reevaluated. Use ice on your toe as often as you can for inflammation, 20 minutes on, 20 minutes off and be sure to have a thin cloth between your skin and the ice.  Return to emergency department immediately if you experience signs of infection to include increased swelling, redness,  warmth, or red streaks, fever, chills, or any other concerning symptoms.

## 2015-11-19 NOTE — ED Provider Notes (Signed)
[facility] DEPT Provider Note   CSN: [id number] Arrival date & time: 11/19/15  [id number]  First Provider Contact:      By signing my name below, I, Ephriam Jenkins, attest that this documentation has been prepared under the direction and in the presence of [facility].  Electronically Signed: Ephriam Jenkins, ED Scribe. 11/20/15. 2:10 AM.  History   Chief Complaint Chief Complaint  Patient presents with  . Foot Pain    HPI The history is provided by the patient. No language interpreter was used.   HPI Comments: Kimberly Vargas is a 45 y.o. female who presents to the Emergency Department complaining of 10/10, non-radiating, throbbing left foot pain onset four days ago. Pt reports worst pain to the first metatarsal. Pt reports she noticed the pain on Thursday evening when she returned home from work. Pt states she goes on frequent walks every day during her lunch break at work. Pt further states that the pain was intermittent the past 3 days but reports the pain became constant today. Pt notes mild relief when wearing tennis shoes due to the increased cushioning. Pt reports pain is exacerbated upon ambulation. Pt has taken two ibuprofen approximately 5 hours ago with no relief. Pt states her father has a Hx of gout. Pt denies Hx of DVT. Pt states she is not on birth control. Pt reports recent travel to [facility]. Patient denies chest pain, shortness of breath, abdominal pain, numbness/timing, weakness, calf pain or swelling, trauma, fever or chills.   Past Medical History:  Diagnosis Date  . Anemia   . Ectopic pregnancy 2001   treated with methotrexate  . NSVD (normal spontaneous vaginal delivery)   . PROM (premature rupture of membranes)    DELIVERY AT 34 WKS    Patient Active Problem List   Diagnosis Date Noted  . Low TSH level 10/28/2014  . Hot flushes, perimenopausal 08/25/2014  . Squeezing chest pain 04/23/2012    Class: Acute  . Varicose veins of lower extremities with other  complications [id number]  . Swelling of limb 11/25/2011  . Leg pain 11/25/2011    Past Surgical History:  Procedure Laterality Date  . DILATION AND CURETTAGE OF UTERUS    . RESECTOSCOPIC POLYPECTOMY  12/25/2005    OB History    Gravida Para Term Preterm AB Living   3 2   2 1 2    SAB TAB Ectopic Multiple Live Births       1           Home Medications    Prior to Admission medications   Medication Sig Start Date End Date Taking? Authorizing Provider  Calcium Carbonate-Vitamin D (CALCIUM + D PO) Take 1 capsule by mouth daily.    Historical Provider, MD  diclofenac (VOLTAREN) 50 MG EC tablet Take 50 mg by mouth 2 (two) times daily.    Historical Provider, MD  famotidine (PEPCID) 20 MG tablet TK 1 T PO  BID 10/23/14   Historical Provider, MD  indomethacin (INDOCIN) 50 MG capsule Take 1 capsule (50 mg total) by mouth 3 (three) times daily with meals. 11/19/15   Kalman Drape, PA  methocarbamol (ROBAXIN) 500 MG tablet Take 1 tablet by mouth daily. 08/23/14   Historical Provider, MD  Multiple Vitamins-Minerals (MULTIVITAMIN PO) Take 1 tablet by mouth daily.    Historical Provider, MD  Omega-3 Fatty Acids (FISH OIL PO) Take 1 tablet by mouth daily.    Historical Provider, MD  predniSONE (STERAPRED UNI-PAK 21 TAB)  10 MG (21) TBPK tablet TK PO UTD 10/23/14   Historical Provider, MD  WAL-DRYL ALLERGY 25 MG capsule TK ONE C PO  Q 6 H PRF ITCHING 10/23/14   Historical Provider, MD    Family History Family History  Problem Relation Age of Onset  . Cancer Maternal Grandmother     OVARIAN  . Hypertension Father   . Heart disease Father     before age 6  . Diabetes Father   . Hyperlipidemia Father   . Heart attack Father   . Hypertension Sister   . Diabetes Paternal Grandmother   . Other Mother     varicose veins    Social History Social History  Substance Use Topics  . Smoking status: Never Smoker  . Smokeless tobacco: Never Used  . Alcohol use No     Allergies   Review of  patient's allergies indicates no known allergies.   Review of Systems Review of Systems  Constitutional: Negative for fever.  Respiratory: Negative for chest tightness and shortness of breath.   Cardiovascular: Negative for chest pain.  Gastrointestinal: Negative for abdominal pain, nausea and vomiting.  Musculoskeletal: Positive for arthralgias and joint swelling.  Skin: Negative for rash.  Neurological: Negative for headaches.     Physical Exam Updated Vital Signs BP 118/76   Pulse 74   Temp 98.5 F (36.9 C) (Oral)   Resp 16   Ht 5\' 4"  (1.626 m)   Wt 156 lb (70.8 kg)   LMP 08/17/2013   SpO2 98%   BMI 26.78 kg/m   Physical Exam  Constitutional: She appears well-developed and well-nourished. No distress.  HENT:  Head: Normocephalic and atraumatic.  Eyes: Conjunctivae are normal.  Neck: Normal range of motion.  Cardiovascular: Normal rate, regular rhythm and normal heart sounds.  Exam reveals no gallop and no friction rub.   No murmur heard. Pulses:      Dorsalis pedis pulses are 2+ on the right side, and 2+ on the left side.  Pulmonary/Chest: Effort normal. No respiratory distress.  Musculoskeletal: Normal range of motion. She exhibits no edema.  Examination of left foot revealed no deformities or ecchymosis, edema and increased redness noted to the first MTP joint, TTP to the first MTP joint, full range of motion of ankle and toes, sensation intact, brisk capillary refill, patient neurovascularly intact distally. No swelling, redness, increased warmth noted to bilateral calves.  Neurological: She is alert. Coordination normal.  Skin: Skin is warm and dry. She is not diaphoretic.  Psychiatric: She has a normal mood and affect. Her behavior is normal.  Nursing note and vitals reviewed.    ED Treatments / Results  DIAGNOSTIC STUDIES: Oxygen Saturation is 98% on RA, normal by my interpretation.  COORDINATION OF CARE: 2:10 AM-Discussed treatment plan with pt at  bedside and pt agreed to plan.   Labs (all labs ordered are listed, but only abnormal results are displayed) Labs Reviewed - No data to display  EKG  EKG Interpretation None       Radiology Dg Foot Complete Left  Result Date: 11/19/2015 CLINICAL DATA:  Medial left foot pain for 3 days.  No known injury. EXAM: LEFT FOOT - COMPLETE 3+ VIEW COMPARISON:  None. FINDINGS: There is no evidence of fracture or dislocation. There is no evidence of arthropathy or other focal bone abnormality. Soft tissues are unremarkable. IMPRESSION: Negative radiographs of the left foot. Electronically Signed   By: Jeb Levering M.D.   On: 11/19/2015 20:12  Procedures Procedures (including critical care time)  Medications Ordered in ED Medications  indomethacin (INDOCIN) capsule 50 mg (50 mg Oral Given 11/19/15 2150)     Initial Impression / Assessment and Plan / ED Course  I have reviewed the triage vital signs and the nursing notes.  Pertinent labs & imaging results that were available during my care of the patient were reviewed by me and considered in my medical decision making (see chart for details).  Clinical Course   Patient presents with left great MTP joint pain. Patient denies wound, trauma, fever, chills, less concerning for septic joint. Presentation and history concerning for gout. Patient's father has a history of gout. X-ray reviewed by me revealed no acute abnormality, fracture or deformity. Discussed RICE and indomethacin use. Instructed patient to follow-up with her primary care provider within 2 days to have her foot reevaluated. Discussed strict return precautions to include signs of joint infection. Patient expressed understanding to the discharge instructions.  I personally performed the services described in this documentation, which was scribed in my presence. The recorded information has been reviewed and is accurate.     Final Clinical Impressions(s) / ED Diagnoses    Final diagnoses:  Great toe pain, left    New Prescriptions Discharge Medication List as of 11/19/2015  9:48 PM    START taking these medications   Details  indomethacin (INDOCIN) 50 MG capsule Take 1 capsule (50 mg total) by mouth 3 (three) times daily with meals., Starting Sun 11/19/2015, Webb, PA 11/20/15 [id number]    Tanna Furry, MD 11/29/15 [phone]

## 2016-02-05 ENCOUNTER — Other Ambulatory Visit: Payer: Self-pay | Admitting: Gynecology

## 2016-02-05 DIAGNOSIS — Z1231 Encounter for screening mammogram for malignant neoplasm of breast: Secondary | ICD-10-CM

## 2016-02-16 ENCOUNTER — Ambulatory Visit
Admission: RE | Admit: 2016-02-16 | Discharge: 2016-02-16 | Disposition: A | Discharge status: Home / Self Care | Payer: 59 | Source: Ambulatory Visit | Attending: Gynecology | Admitting: Gynecology

## 2016-02-16 DIAGNOSIS — Z1231 Encounter for screening mammogram for malignant neoplasm of breast: Secondary | ICD-10-CM

## 2016-03-20 ENCOUNTER — Ambulatory Visit (INDEPENDENT_AMBULATORY_CARE_PROVIDER_SITE_OTHER): Payer: 59 | Admitting: Gynecology

## 2016-03-20 ENCOUNTER — Encounter: Payer: Self-pay | Admitting: Gynecology

## 2016-03-20 VITALS — BP 122/78 | Ht 64.0 in | Wt 152.0 lb

## 2016-03-20 DIAGNOSIS — E288 Other ovarian dysfunction: Secondary | ICD-10-CM

## 2016-03-20 DIAGNOSIS — E2839 Other primary ovarian failure: Secondary | ICD-10-CM | POA: Insufficient documentation

## 2016-03-20 DIAGNOSIS — Z01411 Encounter for gynecological examination (general) (routine) with abnormal findings: Secondary | ICD-10-CM

## 2016-03-20 LAB — COMPREHENSIVE METABOLIC PANEL
ALT: 18 U/L (ref 6–29)
AST: 23 U/L (ref 10–35)
Albumin: 4.5 g/dL (ref 3.6–5.1)
Alkaline Phosphatase: 62 U/L (ref 33–115)
BILIRUBIN TOTAL: 1.2 mg/dL (ref 0.2–1.2)
BUN: 12 mg/dL (ref 7–25)
CHLORIDE: 101 mmol/L (ref 98–110)
CO2: 28 mmol/L (ref 20–31)
CREATININE: 0.95 mg/dL (ref 0.50–1.10)
Calcium: 9.8 mg/dL (ref 8.6–10.2)
Glucose, Bld: 82 mg/dL (ref 65–99)
Potassium: 4.1 mmol/L (ref 3.5–5.3)
SODIUM: 138 mmol/L (ref 135–146)
TOTAL PROTEIN: 7.3 g/dL (ref 6.1–8.1)

## 2016-03-20 LAB — CBC WITH DIFFERENTIAL/PLATELET
BASOS PCT: 0 %
Basophils Absolute: 0 cells/uL (ref 0–200)
EOS PCT: 2 %
Eosinophils Absolute: 66 cells/uL (ref 15–500)
HCT: 40.9 % (ref 35.0–45.0)
Hemoglobin: 13.6 g/dL (ref 11.7–15.5)
Lymphocytes Relative: 37 %
Lymphs Abs: 1221 cells/uL (ref 850–3900)
MCH: 30.7 pg (ref 27.0–33.0)
MCHC: 33.3 g/dL (ref 32.0–36.0)
MCV: 92.3 fL (ref 80.0–100.0)
MONOS PCT: 9 %
MPV: 9.4 fL (ref 7.5–12.5)
Monocytes Absolute: 297 cells/uL (ref 200–950)
NEUTROS ABS: 1716 {cells}/uL (ref 1500–7800)
Neutrophils Relative %: 52 %
PLATELETS: 277 10*3/uL (ref 140–400)
RBC: 4.43 MIL/uL (ref 3.80–5.10)
RDW: 12.7 % (ref 11.0–15.0)
WBC: 3.3 10*3/uL — ABNORMAL LOW (ref 3.8–10.8)

## 2016-03-20 LAB — LIPID PANEL
CHOLESTEROL: 271 mg/dL — AB (ref <200)
HDL: 60 mg/dL (ref >50)
LDL Cholesterol: 189 mg/dL — ABNORMAL HIGH (ref <100)
Total CHOL/HDL Ratio: 4.5 Ratio (ref <5.0)
Triglycerides: 112 mg/dL (ref <150)
VLDL: 22 mg/dL (ref <30)

## 2016-03-20 NOTE — Progress Notes (Signed)
Kimberly Vargas 11/08/1970 [id number]   History:    45 y.o.  for annual gyn exam with history of premature ovarian failure. In 2016 and her [place] was elevated at 110. Patient with minimal vasomotor symptoms. Patient uses peppermint oil transdermally when necessary. Patient's tried different oral contraceptive pill regimens in the past that resulted breakthrough bleeding so she is not on anything at the present time. Patient not sexually active. No past history of any abnormal Pap smears.   Past medical history,surgical history, family history and social history were all reviewed and documented in the EPIC chart.  Gynecologic History Patient's last menstrual period was 08/17/2013. Contraception: none Last Pap:  2015. Results were: normal Last mammogram:  2017. Results were: normal  Obstetric History OB History  Gravida Para Term Preterm AB Living  3 2   2 1 2   SAB TAB Ectopic Multiple Live Births      1        # Outcome Date GA Lbr Len/2nd Weight Sex Delivery Anes PTL Lv  3 Ectopic           2 Preterm           1 Preterm                ROS: A ROS was performed and pertinent positives and negatives are included in the history.  GENERAL: No fevers or chills. HEENT: No change in vision, no earache, sore throat or sinus congestion. NECK: No pain or stiffness. CARDIOVASCULAR: No chest pain or pressure. No palpitations. PULMONARY: No shortness of breath, cough or wheeze. GASTROINTESTINAL: No abdominal pain, nausea, vomiting or diarrhea, melena or bright red blood per rectum. GENITOURINARY: No urinary frequency, urgency, hesitancy or dysuria. MUSCULOSKELETAL: No joint or muscle pain, no back pain, no recent trauma. DERMATOLOGIC: No rash, no itching, no lesions. ENDOCRINE: No polyuria, polydipsia, no heat or cold intolerance. No recent change in weight. HEMATOLOGICAL: No anemia or easy bruising or bleeding. NEUROLOGIC: No headache, seizures, numbness, tingling or weakness. PSYCHIATRIC: No  depression, no loss of interest in normal activity or change in sleep pattern.     Exam: chaperone present  BP 122/78   Ht 5\' 4"  (1.626 m)   Wt 152 lb (68.9 kg)   LMP 08/17/2013   BMI 26.09 kg/m   Body mass index is 26.09 kg/m.  General appearance : Well developed well nourished female. No acute distress HEENT: Eyes: no retinal hemorrhage or exudates,  Neck supple, trachea midline, no carotid bruits, no thyroidmegaly Lungs: Clear to auscultation, no rhonchi or wheezes, or rib retractions  Heart: Regular rate and rhythm, no murmurs or gallops Breast:Examined in sitting and supine position were symmetrical in appearance, no palpable masses or tenderness,  no skin retraction, no nipple inversion, no nipple discharge, no skin discoloration, no axillary or supraclavicular lymphadenopathy Abdomen: no palpable masses or tenderness, no rebound or guarding Extremities: no edema or skin discoloration or tenderness  Pelvic:  Bartholin, Urethra, Skene Glands: Within normal limits             Vagina: No gross lesions or discharge, well estrogenized   Cervix: No gross lesions or discharge  Uterus  anteverted, normal size, shape and consistency, non-tender and mobile  Adnexa  Without masses or tenderness  Anus and perineum  normal   Rectovaginal  normal sphincter tone without palpated masses or tenderness             Hemoccult  not indicated  Assessment/Plan:  45 y.o. female for annual exam  with premature ovarian failure on no hormone replacement therapy not sexually active. We discussed importance of calcium vitamin D and weightbearing exercises for osteoporosis prevention. Patient's last menstrual period was in 2015 I recommend a baseline bone density study next year. Pap smear not indicated this year. The following screening fasting blood work was ordered today: Fasting lipid profile, comprehensive metabolic panel, TSH, CBC, and urinalysis. Patient received her flu vaccine at  work.   Terrance Mass MD, 3:32 PM 03/20/2016

## 2016-03-21 ENCOUNTER — Other Ambulatory Visit: Payer: Self-pay | Admitting: Gynecology

## 2016-03-21 DIAGNOSIS — E7800 Pure hypercholesterolemia, unspecified: Secondary | ICD-10-CM

## 2016-03-21 DIAGNOSIS — D708 Other neutropenia: Secondary | ICD-10-CM

## 2016-03-21 LAB — URINALYSIS W MICROSCOPIC + REFLEX CULTURE
BACTERIA UA: NONE SEEN [HPF]
Bilirubin Urine: NEGATIVE
CASTS: NONE SEEN [LPF]
CRYSTALS: NONE SEEN [HPF]
Glucose, UA: NEGATIVE
HGB URINE DIPSTICK: NEGATIVE
Nitrite: NEGATIVE
PH: 5.5 (ref 5.0–8.0)
PROTEIN: NEGATIVE
Specific Gravity, Urine: 1.023 (ref 1.001–1.035)
Yeast: NONE SEEN [HPF]

## 2016-03-21 LAB — VITAMIN D 25 HYDROXY (VIT D DEFICIENCY, FRACTURES): VIT D 25 HYDROXY: 40 ng/mL (ref 30–100)

## 2016-03-21 LAB — TSH: TSH: 1.7 mIU/L

## 2016-03-22 LAB — URINE CULTURE

## 2016-09-04 ENCOUNTER — Encounter: Payer: Self-pay | Admitting: Gynecology

## 2017-03-05 ENCOUNTER — Other Ambulatory Visit: Payer: Self-pay | Admitting: Obstetrics & Gynecology

## 2017-03-05 ENCOUNTER — Other Ambulatory Visit: Payer: Self-pay | Admitting: Gynecology

## 2017-03-05 DIAGNOSIS — Z139 Encounter for screening, unspecified: Secondary | ICD-10-CM

## 2017-04-03 ENCOUNTER — Ambulatory Visit
Admission: RE | Admit: 2017-04-03 | Discharge: 2017-04-03 | Disposition: A | Discharge status: Home / Self Care | Payer: 59 | Source: Ambulatory Visit | Attending: Obstetrics & Gynecology | Admitting: Obstetrics & Gynecology

## 2017-04-03 DIAGNOSIS — Z139 Encounter for screening, unspecified: Secondary | ICD-10-CM

## 2017-04-08 ENCOUNTER — Ambulatory Visit (INDEPENDENT_AMBULATORY_CARE_PROVIDER_SITE_OTHER): Payer: 59 | Admitting: Obstetrics & Gynecology

## 2017-04-08 ENCOUNTER — Encounter: Payer: Self-pay | Admitting: Obstetrics & Gynecology

## 2017-04-08 VITALS — BP 128/74 | Ht 64.0 in | Wt 154.0 lb

## 2017-04-08 DIAGNOSIS — Z780 Asymptomatic menopausal state: Secondary | ICD-10-CM

## 2017-04-08 DIAGNOSIS — Z1151 Encounter for screening for human papillomavirus (HPV): Secondary | ICD-10-CM | POA: Diagnosis not present

## 2017-04-08 DIAGNOSIS — Z01419 Encounter for gynecological examination (general) (routine) without abnormal findings: Secondary | ICD-10-CM

## 2017-04-08 NOTE — Progress Notes (Signed)
Kimberly Vargas 05-15-1970 [id number]   History:    46 y.o. G3P2A1L2  RP:  Established patient presenting for annual gyn exam   HPI:  Menopause x2015 at age 34 confirmed with high FSH.  Well tolerated without HRT.  No PMB.  No Pelvic Pain.  Abstinent currently.  Breast normal.  Urine and bowel movements normal.  On vitamin D supplements and exercising regularly.  Health labs here today.  Past medical history,surgical history, family history and social history were all reviewed and documented in the EPIC chart.  Gynecologic History Patient's last menstrual period was 08/17/2013. Contraception: post menopausal status Last Pap: 2015. Results were: normal Last mammogram: 03/2017. Results were: Negative No bone density done yet  Obstetric History OB History  Gravida Para Term Preterm AB Living  _0 SAB TAB Ectopic Multiple Live Births      1        # Outcome Date GA Lbr Len/2nd Weight Sex Delivery Anes PTL Lv  3 Ectopic           2 Preterm           1 Preterm                ROS: A ROS was performed and pertinent positives and negatives are included in the history.  GENERAL: No fevers or chills. HEENT: No change in vision, no earache, sore throat or sinus congestion. NECK: No pain or stiffness. CARDIOVASCULAR: No chest pain or pressure. No palpitations. PULMONARY: No shortness of breath, cough or wheeze. GASTROINTESTINAL: No abdominal pain, nausea, vomiting or diarrhea, melena or bright red blood per rectum. GENITOURINARY: No urinary frequency, urgency, hesitancy or dysuria. MUSCULOSKELETAL: No joint or muscle pain, no back pain, no recent trauma. DERMATOLOGIC: No rash, no itching, no lesions. ENDOCRINE: No polyuria, polydipsia, no heat or cold intolerance. No recent change in weight. HEMATOLOGICAL: No anemia or easy bruising or bleeding. NEUROLOGIC: No headache, seizures, numbness, tingling or weakness. PSYCHIATRIC: No depression, no loss of interest in normal activity or  change in sleep pattern.     Exam:   BP 128/74   Ht _1  (1.626 m)   Wt 154 lb (69.9 kg)   LMP 08/17/2013   BMI 26.43 kg/m   Body mass index is 26.43 kg/m.  General appearance : Well developed well nourished female. No acute distress HEENT: Eyes: no retinal hemorrhage or exudates,  Neck supple, trachea midline, no carotid bruits, no thyroidmegaly Lungs: Clear to auscultation, no rhonchi or wheezes, or rib retractions  Heart: Regular rate and rhythm, no murmurs or gallops Breast:Examined in sitting and supine position were symmetrical in appearance, no palpable masses or tenderness,  no skin retraction, no nipple inversion, no nipple discharge, no skin discoloration, no axillary or supraclavicular lymphadenopathy Abdomen: no palpable masses or tenderness, no rebound or guarding Extremities: no edema or skin discoloration or tenderness  Pelvic: Vulva normal  Bartholin, Urethra, Skene Glands: Within normal limits             Vagina: No gross lesions or discharge  Cervix: No gross lesions or discharge.  Pap/HR HPV done.  Uterus  AV, normal size, shape and consistency, non-tender and mobile  Adnexa  Without masses or tenderness  Anus and perineum  normal    Assessment/Plan:  46 y.o. female for annual exam   1. Encounter for routine gynecological examination with Papanicolaou smear of cervix Normal gyn exam.  Pap/HR HPV done.  Breast exam normal.  Normal screening mammogram December 2018.  Fasting health labs here today. - CBC; Future - Comp Met (CMET); Future - Lipid panel; Future - TSH; Future - Vitamin D 1,25 dihydroxy; Future  2. Menopause present Menopause at 46 year old.  Well without hormone replacement therapy.  Benefits and risk of hormone replacement therapy with relatively early menopause discussed.  Patient prefers not to start at this point.  Risk of osteoporosis given faster bone loss  in  menopause discussed.  Will continue vitamin D supplements, calcium rich  diet and weightbearing physical activity.  Will organize bone density next year.  Princess Bruins MD, 3:26 PM 04/08/2017

## 2017-04-10 LAB — PAP, TP IMAGING W/ HPV RNA, RFLX HPV TYPE 16,18/45: HPV DNA High Risk: NOT DETECTED

## 2017-04-13 ENCOUNTER — Encounter: Payer: Self-pay | Admitting: Obstetrics & Gynecology

## 2017-04-13 NOTE — Patient Instructions (Signed)
1. Encounter for routine gynecological examination with Papanicolaou smear of cervix Normal gyn exam.  Pap/HR HPV done.  Breast exam normal.  Normal screening mammogram December 2018.  Fasting health labs here today. - CBC; Future - Comp Met (CMET); Future - Lipid panel; Future - TSH; Future - Vitamin D 1,25 dihydroxy; Future  2. Menopause present Menopause at 46 year old.  Well without hormone replacement therapy.  Benefits and risk of hormone replacement therapy with relatively early menopause discussed.  Patient prefers not to start at this point.  Risk of osteoporosis given faster bone loss  in  menopause discussed.  Will continue vitamin D supplements, calcium rich diet and weightbearing physical activity.  Will organize bone density next year.  Kimberly Vargas, it was a pleasure meeting you today!  I will inform you of your lab results as soon as they are available.   Health Maintenance for Postmenopausal Women Menopause is a normal process in which your reproductive ability comes to an end. This process happens gradually over a span of months to years, usually between the ages of 68 and 64. Menopause is complete when you have missed 12 consecutive menstrual periods. It is important to talk with your health care provider about some of the most common conditions that affect postmenopausal women, such as heart disease, cancer, and bone loss (osteoporosis). Adopting a healthy lifestyle and getting preventive care can help to promote your health and wellness. Those actions can also lower your chances of developing some of these common conditions. What should I know about menopause? During menopause, you may experience a number of symptoms, such as:  Moderate-to-severe hot flashes.  Night sweats.  Decrease in sex drive.  Mood swings.  Headaches.  Tiredness.  Irritability.  Memory problems.  Insomnia.  Choosing to treat or not to treat menopausal changes is an individual decision that you  make with your health care provider. What should I know about hormone replacement therapy and supplements? Hormone therapy products are effective for treating symptoms that are associated with menopause, such as hot flashes and night sweats. Hormone replacement carries certain risks, especially as you become older. If you are thinking about using estrogen or estrogen with progestin treatments, discuss the benefits and risks with your health care provider. What should I know about heart disease and stroke? Heart disease, heart attack, and stroke become more likely as you age. This may be due, in part, to the hormonal changes that your body experiences during menopause. These can affect how your body processes dietary fats, triglycerides, and cholesterol. Heart attack and stroke are both medical emergencies. There are many things that you can do to help prevent heart disease and stroke:  Have your blood pressure checked at least every 1-2 years. High blood pressure causes heart disease and increases the risk of stroke.  If you are 62-12 years old, ask your health care provider if you should take aspirin to prevent a heart attack or a stroke.  Do not use any tobacco products, including cigarettes, chewing tobacco, or electronic cigarettes. If you need help quitting, ask your health care provider.  It is important to eat a healthy diet and maintain a healthy weight. ? Be sure to include plenty of vegetables, fruits, low-fat dairy products, and lean protein. ? Avoid eating foods that are high in solid fats, added sugars, or salt (sodium).  Get regular exercise. This is one of the most important things that you can do for your health. ? Try to exercise for at least  150 minutes each week. The type of exercise that you do should increase your heart rate and make you sweat. This is known as moderate-intensity exercise. ? Try to do strengthening exercises at least twice each week. Do these in addition to  the moderate-intensity exercise.  Know your numbers.Ask your health care provider to check your cholesterol and your blood glucose. Continue to have your blood tested as directed by your health care provider.  What should I know about cancer screening? There are several types of cancer. Take the following steps to reduce your risk and to catch any cancer development as early as possible. Breast Cancer  Practice breast self-awareness. ? This means understanding how your breasts normally appear and feel. ? It also means doing regular breast self-exams. Let your health care provider know about any changes, no matter how small.  If you are 48 or older, have a clinician do a breast exam (clinical breast exam or CBE) every year. Depending on your age, family history, and medical history, it may be recommended that you also have a yearly breast X-ray (mammogram).  If you have a family history of breast cancer, talk with your health care provider about genetic screening.  If you are at high risk for breast cancer, talk with your health care provider about having an MRI and a mammogram every year.  Breast cancer (BRCA) gene test is recommended for women who have family members with BRCA-related cancers. Results of the assessment will determine the need for genetic counseling and BRCA1 and for BRCA2 testing. BRCA-related cancers include these types: ? Breast. This occurs in males or females. ? Ovarian. ? Tubal. This may also be called fallopian tube cancer. ? Cancer of the abdominal or pelvic lining (peritoneal cancer). ? Prostate. ? Pancreatic.  Cervical, Uterine, and Ovarian Cancer Your health care provider may recommend that you be screened regularly for cancer of the pelvic organs. These include your ovaries, uterus, and vagina. This screening involves a pelvic exam, which includes checking for microscopic changes to the surface of your cervix (Pap test).  For women ages 21-65, health care  providers may recommend a pelvic exam and a Pap test every three years. For women ages 64-65, they may recommend the Pap test and pelvic exam, combined with testing for human papilloma virus (HPV), every five years. Some types of HPV increase your risk of cervical cancer. Testing for HPV may also be done on women of any age who have unclear Pap test results.  Other health care providers may not recommend any screening for nonpregnant women who are considered low risk for pelvic cancer and have no symptoms. Ask your health care provider if a screening pelvic exam is right for you.  If you have had past treatment for cervical cancer or a condition that could lead to cancer, you need Pap tests and screening for cancer for at least 20 years after your treatment. If Pap tests have been discontinued for you, your risk factors (such as having a new sexual partner) need to be reassessed to determine if you should start having screenings again. Some women have medical problems that increase the chance of getting cervical cancer. In these cases, your health care provider may recommend that you have screening and Pap tests more often.  If you have a family history of uterine cancer or ovarian cancer, talk with your health care provider about genetic screening.  If you have vaginal bleeding after reaching menopause, tell your health care provider.  There are currently no reliable tests available to screen for ovarian cancer.  Lung Cancer Lung cancer screening is recommended for adults 22-26 years old who are at high risk for lung cancer because of a history of smoking. A yearly low-dose CT scan of the lungs is recommended if you:  Currently smoke.  Have a history of at least 30 pack-years of smoking and you currently smoke or have quit within the past 15 years. A pack-year is smoking an average of one pack of cigarettes per day for one year.  Yearly screening should:  Continue until it has been 15 years  since you quit.  Stop if you develop a health problem that would prevent you from having lung cancer treatment.  Colorectal Cancer  This type of cancer can be detected and can often be prevented.  Routine colorectal cancer screening usually begins at age 40 and continues through age 41.  If you have risk factors for colon cancer, your health care provider may recommend that you be screened at an earlier age.  If you have a family history of colorectal cancer, talk with your health care provider about genetic screening.  Your health care provider may also recommend using home test kits to check for hidden blood in your stool.  A small camera at the end of a tube can be used to examine your colon directly (sigmoidoscopy or colonoscopy). This is done to check for the earliest forms of colorectal cancer.  Direct examination of the colon should be repeated every 5-10 years until age 76. However, if early forms of precancerous polyps or small growths are found or if you have a family history or genetic risk for colorectal cancer, you may need to be screened more often.  Skin Cancer  Check your skin from head to toe regularly.  Monitor any moles. Be sure to tell your health care provider: ? About any new moles or changes in moles, especially if there is a change in a mole's shape or color. ? If you have a mole that is larger than the size of a pencil eraser.  If any of your family members has a history of skin cancer, especially at a young age, talk with your health care provider about genetic screening.  Always use sunscreen. Apply sunscreen liberally and repeatedly throughout the day.  Whenever you are outside, protect yourself by wearing long sleeves, pants, a wide-brimmed hat, and sunglasses.  What should I know about osteoporosis? Osteoporosis is a condition in which bone destruction happens more quickly than new bone creation. After menopause, you may be at an increased risk for  osteoporosis. To help prevent osteoporosis or the bone fractures that can happen because of osteoporosis, the following is recommended:  If you are 81-47 years old, get at least 1,000 mg of calcium and at least 600 mg of vitamin D per day.  If you are older than age 1 but younger than age 83, get at least 1,200 mg of calcium and at least 600 mg of vitamin D per day.  If you are older than age 85, get at least 1,200 mg of calcium and at least 800 mg of vitamin D per day.  Smoking and excessive alcohol intake increase the risk of osteoporosis. Eat foods that are rich in calcium and vitamin D, and do weight-bearing exercises several times each week as directed by your health care provider. What should I know about how menopause affects my mental health? Depression may occur at any  age, but it is more common as you become older. Common symptoms of depression include:  Low or sad mood.  Changes in sleep patterns.  Changes in appetite or eating patterns.  Feeling an overall lack of motivation or enjoyment of activities that you previously enjoyed.  Frequent crying spells.  Talk with your health care provider if you think that you are experiencing depression. What should I know about immunizations? It is important that you get and maintain your immunizations. These include:  Tetanus, diphtheria, and pertussis (Tdap) booster vaccine.  Influenza every year before the flu season begins.  Pneumonia vaccine.  Shingles vaccine.  Your health care provider may also recommend other immunizations. This information is not intended to replace advice given to you by your health care provider. Make sure you discuss any questions you have with your health care provider. Document Released: 05/31/2005 Document Revised: 10/27/2015 Document Reviewed: 01/10/2015 Elsevier Interactive Patient Education  2018 [employer].

## 2018-06-01 DIAGNOSIS — H109 Unspecified conjunctivitis: Secondary | ICD-10-CM | POA: Diagnosis not present

## 2018-06-01 DIAGNOSIS — R05 Cough: Secondary | ICD-10-CM | POA: Diagnosis not present

## 2018-06-01 DIAGNOSIS — J069 Acute upper respiratory infection, unspecified: Secondary | ICD-10-CM | POA: Diagnosis not present

## 2018-06-01 DIAGNOSIS — R5381 Other malaise: Secondary | ICD-10-CM | POA: Diagnosis not present

## 2018-06-01 DIAGNOSIS — R5383 Other fatigue: Secondary | ICD-10-CM | POA: Diagnosis not present

## 2018-07-08 ENCOUNTER — Encounter: Payer: 59 | Admitting: Obstetrics & Gynecology

## 2019-01-11 DIAGNOSIS — M25511 Pain in right shoulder: Secondary | ICD-10-CM | POA: Diagnosis not present

## 2019-01-19 DIAGNOSIS — M25511 Pain in right shoulder: Secondary | ICD-10-CM | POA: Diagnosis not present

## 2019-01-20 DIAGNOSIS — M7521 Bicipital tendinitis, right shoulder: Secondary | ICD-10-CM | POA: Diagnosis not present

## 2019-02-17 DIAGNOSIS — M7521 Bicipital tendinitis, right shoulder: Secondary | ICD-10-CM | POA: Diagnosis not present

## 2019-02-21 HISTORY — PX: SHOULDER SURGERY: SHX246

## 2019-03-04 DIAGNOSIS — G8918 Other acute postprocedural pain: Secondary | ICD-10-CM | POA: Diagnosis not present

## 2019-03-04 DIAGNOSIS — M24011 Loose body in right shoulder: Secondary | ICD-10-CM | POA: Diagnosis not present

## 2019-03-04 DIAGNOSIS — M7541 Impingement syndrome of right shoulder: Secondary | ICD-10-CM | POA: Diagnosis not present

## 2019-03-04 DIAGNOSIS — M7501 Adhesive capsulitis of right shoulder: Secondary | ICD-10-CM | POA: Diagnosis not present

## 2019-03-04 DIAGNOSIS — M7521 Bicipital tendinitis, right shoulder: Secondary | ICD-10-CM | POA: Diagnosis not present

## 2019-03-05 DIAGNOSIS — M6281 Muscle weakness (generalized): Secondary | ICD-10-CM | POA: Diagnosis not present

## 2019-03-05 DIAGNOSIS — S46111D Strain of muscle, fascia and tendon of long head of biceps, right arm, subsequent encounter: Secondary | ICD-10-CM | POA: Diagnosis not present

## 2019-03-05 DIAGNOSIS — M25611 Stiffness of right shoulder, not elsewhere classified: Secondary | ICD-10-CM | POA: Diagnosis not present

## 2019-03-05 DIAGNOSIS — M7501 Adhesive capsulitis of right shoulder: Secondary | ICD-10-CM | POA: Diagnosis not present

## 2019-03-09 DIAGNOSIS — M6281 Muscle weakness (generalized): Secondary | ICD-10-CM | POA: Diagnosis not present

## 2019-03-09 DIAGNOSIS — M7501 Adhesive capsulitis of right shoulder: Secondary | ICD-10-CM | POA: Diagnosis not present

## 2019-03-09 DIAGNOSIS — S46111D Strain of muscle, fascia and tendon of long head of biceps, right arm, subsequent encounter: Secondary | ICD-10-CM | POA: Diagnosis not present

## 2019-03-09 DIAGNOSIS — M25611 Stiffness of right shoulder, not elsewhere classified: Secondary | ICD-10-CM | POA: Diagnosis not present

## 2019-03-11 DIAGNOSIS — M7501 Adhesive capsulitis of right shoulder: Secondary | ICD-10-CM | POA: Diagnosis not present

## 2019-03-11 DIAGNOSIS — M25611 Stiffness of right shoulder, not elsewhere classified: Secondary | ICD-10-CM | POA: Diagnosis not present

## 2019-03-11 DIAGNOSIS — M6281 Muscle weakness (generalized): Secondary | ICD-10-CM | POA: Diagnosis not present

## 2019-03-11 DIAGNOSIS — S46111D Strain of muscle, fascia and tendon of long head of biceps, right arm, subsequent encounter: Secondary | ICD-10-CM | POA: Diagnosis not present

## 2019-03-12 DIAGNOSIS — S46111D Strain of muscle, fascia and tendon of long head of biceps, right arm, subsequent encounter: Secondary | ICD-10-CM | POA: Diagnosis not present

## 2019-03-12 DIAGNOSIS — M25611 Stiffness of right shoulder, not elsewhere classified: Secondary | ICD-10-CM | POA: Diagnosis not present

## 2019-03-12 DIAGNOSIS — M7501 Adhesive capsulitis of right shoulder: Secondary | ICD-10-CM | POA: Diagnosis not present

## 2019-03-12 DIAGNOSIS — M6281 Muscle weakness (generalized): Secondary | ICD-10-CM | POA: Diagnosis not present

## 2019-03-15 DIAGNOSIS — S46111D Strain of muscle, fascia and tendon of long head of biceps, right arm, subsequent encounter: Secondary | ICD-10-CM | POA: Diagnosis not present

## 2019-03-15 DIAGNOSIS — M6281 Muscle weakness (generalized): Secondary | ICD-10-CM | POA: Diagnosis not present

## 2019-03-15 DIAGNOSIS — M25611 Stiffness of right shoulder, not elsewhere classified: Secondary | ICD-10-CM | POA: Diagnosis not present

## 2019-03-15 DIAGNOSIS — M7501 Adhesive capsulitis of right shoulder: Secondary | ICD-10-CM | POA: Diagnosis not present

## 2019-03-17 DIAGNOSIS — M7501 Adhesive capsulitis of right shoulder: Secondary | ICD-10-CM | POA: Diagnosis not present

## 2019-03-17 DIAGNOSIS — M6281 Muscle weakness (generalized): Secondary | ICD-10-CM | POA: Diagnosis not present

## 2019-03-17 DIAGNOSIS — S46111D Strain of muscle, fascia and tendon of long head of biceps, right arm, subsequent encounter: Secondary | ICD-10-CM | POA: Diagnosis not present

## 2019-03-17 DIAGNOSIS — M25611 Stiffness of right shoulder, not elsewhere classified: Secondary | ICD-10-CM | POA: Diagnosis not present

## 2019-03-24 DIAGNOSIS — S46111D Strain of muscle, fascia and tendon of long head of biceps, right arm, subsequent encounter: Secondary | ICD-10-CM | POA: Diagnosis not present

## 2019-03-24 DIAGNOSIS — M25611 Stiffness of right shoulder, not elsewhere classified: Secondary | ICD-10-CM | POA: Diagnosis not present

## 2019-03-24 DIAGNOSIS — M6281 Muscle weakness (generalized): Secondary | ICD-10-CM | POA: Diagnosis not present

## 2019-03-24 DIAGNOSIS — M7501 Adhesive capsulitis of right shoulder: Secondary | ICD-10-CM | POA: Diagnosis not present

## 2019-03-25 DIAGNOSIS — M6281 Muscle weakness (generalized): Secondary | ICD-10-CM | POA: Diagnosis not present

## 2019-03-25 DIAGNOSIS — M25611 Stiffness of right shoulder, not elsewhere classified: Secondary | ICD-10-CM | POA: Diagnosis not present

## 2019-03-25 DIAGNOSIS — S46111D Strain of muscle, fascia and tendon of long head of biceps, right arm, subsequent encounter: Secondary | ICD-10-CM | POA: Diagnosis not present

## 2019-03-25 DIAGNOSIS — M7501 Adhesive capsulitis of right shoulder: Secondary | ICD-10-CM | POA: Diagnosis not present

## 2019-03-31 DIAGNOSIS — M25611 Stiffness of right shoulder, not elsewhere classified: Secondary | ICD-10-CM | POA: Diagnosis not present

## 2019-03-31 DIAGNOSIS — M7501 Adhesive capsulitis of right shoulder: Secondary | ICD-10-CM | POA: Diagnosis not present

## 2019-03-31 DIAGNOSIS — S46111D Strain of muscle, fascia and tendon of long head of biceps, right arm, subsequent encounter: Secondary | ICD-10-CM | POA: Diagnosis not present

## 2019-03-31 DIAGNOSIS — M6281 Muscle weakness (generalized): Secondary | ICD-10-CM | POA: Diagnosis not present

## 2019-04-05 DIAGNOSIS — M6281 Muscle weakness (generalized): Secondary | ICD-10-CM | POA: Diagnosis not present

## 2019-04-05 DIAGNOSIS — M25611 Stiffness of right shoulder, not elsewhere classified: Secondary | ICD-10-CM | POA: Diagnosis not present

## 2019-04-05 DIAGNOSIS — S46111D Strain of muscle, fascia and tendon of long head of biceps, right arm, subsequent encounter: Secondary | ICD-10-CM | POA: Diagnosis not present

## 2019-04-05 DIAGNOSIS — M7501 Adhesive capsulitis of right shoulder: Secondary | ICD-10-CM | POA: Diagnosis not present

## 2019-04-07 DIAGNOSIS — M6281 Muscle weakness (generalized): Secondary | ICD-10-CM | POA: Diagnosis not present

## 2019-04-07 DIAGNOSIS — M25611 Stiffness of right shoulder, not elsewhere classified: Secondary | ICD-10-CM | POA: Diagnosis not present

## 2019-04-07 DIAGNOSIS — S46111D Strain of muscle, fascia and tendon of long head of biceps, right arm, subsequent encounter: Secondary | ICD-10-CM | POA: Diagnosis not present

## 2019-04-07 DIAGNOSIS — M7501 Adhesive capsulitis of right shoulder: Secondary | ICD-10-CM | POA: Diagnosis not present

## 2019-04-12 DIAGNOSIS — M6281 Muscle weakness (generalized): Secondary | ICD-10-CM | POA: Diagnosis not present

## 2019-04-12 DIAGNOSIS — M25611 Stiffness of right shoulder, not elsewhere classified: Secondary | ICD-10-CM | POA: Diagnosis not present

## 2019-04-12 DIAGNOSIS — S46111D Strain of muscle, fascia and tendon of long head of biceps, right arm, subsequent encounter: Secondary | ICD-10-CM | POA: Diagnosis not present

## 2019-04-12 DIAGNOSIS — M7501 Adhesive capsulitis of right shoulder: Secondary | ICD-10-CM | POA: Diagnosis not present

## 2019-04-14 DIAGNOSIS — M6281 Muscle weakness (generalized): Secondary | ICD-10-CM | POA: Diagnosis not present

## 2019-04-14 DIAGNOSIS — M25611 Stiffness of right shoulder, not elsewhere classified: Secondary | ICD-10-CM | POA: Diagnosis not present

## 2019-04-14 DIAGNOSIS — M7501 Adhesive capsulitis of right shoulder: Secondary | ICD-10-CM | POA: Diagnosis not present

## 2019-04-14 DIAGNOSIS — S46111D Strain of muscle, fascia and tendon of long head of biceps, right arm, subsequent encounter: Secondary | ICD-10-CM | POA: Diagnosis not present

## 2019-04-20 DIAGNOSIS — M7501 Adhesive capsulitis of right shoulder: Secondary | ICD-10-CM | POA: Diagnosis not present

## 2019-04-20 DIAGNOSIS — M25611 Stiffness of right shoulder, not elsewhere classified: Secondary | ICD-10-CM | POA: Diagnosis not present

## 2019-04-20 DIAGNOSIS — M6281 Muscle weakness (generalized): Secondary | ICD-10-CM | POA: Diagnosis not present

## 2019-04-20 DIAGNOSIS — S46111D Strain of muscle, fascia and tendon of long head of biceps, right arm, subsequent encounter: Secondary | ICD-10-CM | POA: Diagnosis not present

## 2019-04-22 DIAGNOSIS — M7501 Adhesive capsulitis of right shoulder: Secondary | ICD-10-CM | POA: Diagnosis not present

## 2019-04-22 DIAGNOSIS — S46111D Strain of muscle, fascia and tendon of long head of biceps, right arm, subsequent encounter: Secondary | ICD-10-CM | POA: Diagnosis not present

## 2019-04-22 DIAGNOSIS — M6281 Muscle weakness (generalized): Secondary | ICD-10-CM | POA: Diagnosis not present

## 2019-04-22 DIAGNOSIS — M25611 Stiffness of right shoulder, not elsewhere classified: Secondary | ICD-10-CM | POA: Diagnosis not present

## 2019-04-26 DIAGNOSIS — S46111D Strain of muscle, fascia and tendon of long head of biceps, right arm, subsequent encounter: Secondary | ICD-10-CM | POA: Diagnosis not present

## 2019-04-26 DIAGNOSIS — M6281 Muscle weakness (generalized): Secondary | ICD-10-CM | POA: Diagnosis not present

## 2019-04-26 DIAGNOSIS — M25611 Stiffness of right shoulder, not elsewhere classified: Secondary | ICD-10-CM | POA: Diagnosis not present

## 2019-04-26 DIAGNOSIS — M7501 Adhesive capsulitis of right shoulder: Secondary | ICD-10-CM | POA: Diagnosis not present

## 2019-04-28 DIAGNOSIS — M25611 Stiffness of right shoulder, not elsewhere classified: Secondary | ICD-10-CM | POA: Diagnosis not present

## 2019-04-28 DIAGNOSIS — M7501 Adhesive capsulitis of right shoulder: Secondary | ICD-10-CM | POA: Diagnosis not present

## 2019-04-28 DIAGNOSIS — S46111D Strain of muscle, fascia and tendon of long head of biceps, right arm, subsequent encounter: Secondary | ICD-10-CM | POA: Diagnosis not present

## 2019-04-28 DIAGNOSIS — M6281 Muscle weakness (generalized): Secondary | ICD-10-CM | POA: Diagnosis not present

## 2019-05-03 DIAGNOSIS — M7501 Adhesive capsulitis of right shoulder: Secondary | ICD-10-CM | POA: Diagnosis not present

## 2019-05-03 DIAGNOSIS — M25611 Stiffness of right shoulder, not elsewhere classified: Secondary | ICD-10-CM | POA: Diagnosis not present

## 2019-05-03 DIAGNOSIS — M6281 Muscle weakness (generalized): Secondary | ICD-10-CM | POA: Diagnosis not present

## 2019-05-03 DIAGNOSIS — S46111D Strain of muscle, fascia and tendon of long head of biceps, right arm, subsequent encounter: Secondary | ICD-10-CM | POA: Diagnosis not present

## 2019-05-05 DIAGNOSIS — S46111D Strain of muscle, fascia and tendon of long head of biceps, right arm, subsequent encounter: Secondary | ICD-10-CM | POA: Diagnosis not present

## 2019-05-05 DIAGNOSIS — M25611 Stiffness of right shoulder, not elsewhere classified: Secondary | ICD-10-CM | POA: Diagnosis not present

## 2019-05-05 DIAGNOSIS — M6281 Muscle weakness (generalized): Secondary | ICD-10-CM | POA: Diagnosis not present

## 2019-05-05 DIAGNOSIS — M7501 Adhesive capsulitis of right shoulder: Secondary | ICD-10-CM | POA: Diagnosis not present

## 2019-05-10 DIAGNOSIS — S46111D Strain of muscle, fascia and tendon of long head of biceps, right arm, subsequent encounter: Secondary | ICD-10-CM | POA: Diagnosis not present

## 2019-05-10 DIAGNOSIS — M7501 Adhesive capsulitis of right shoulder: Secondary | ICD-10-CM | POA: Diagnosis not present

## 2019-05-10 DIAGNOSIS — M6281 Muscle weakness (generalized): Secondary | ICD-10-CM | POA: Diagnosis not present

## 2019-05-10 DIAGNOSIS — M25611 Stiffness of right shoulder, not elsewhere classified: Secondary | ICD-10-CM | POA: Diagnosis not present

## 2019-05-12 DIAGNOSIS — S46111D Strain of muscle, fascia and tendon of long head of biceps, right arm, subsequent encounter: Secondary | ICD-10-CM | POA: Diagnosis not present

## 2019-05-12 DIAGNOSIS — M6281 Muscle weakness (generalized): Secondary | ICD-10-CM | POA: Diagnosis not present

## 2019-05-12 DIAGNOSIS — M7501 Adhesive capsulitis of right shoulder: Secondary | ICD-10-CM | POA: Diagnosis not present

## 2019-05-12 DIAGNOSIS — M25611 Stiffness of right shoulder, not elsewhere classified: Secondary | ICD-10-CM | POA: Diagnosis not present

## 2019-05-17 DIAGNOSIS — S46111D Strain of muscle, fascia and tendon of long head of biceps, right arm, subsequent encounter: Secondary | ICD-10-CM | POA: Diagnosis not present

## 2019-05-17 DIAGNOSIS — M25611 Stiffness of right shoulder, not elsewhere classified: Secondary | ICD-10-CM | POA: Diagnosis not present

## 2019-05-17 DIAGNOSIS — M7501 Adhesive capsulitis of right shoulder: Secondary | ICD-10-CM | POA: Diagnosis not present

## 2019-05-17 DIAGNOSIS — M6281 Muscle weakness (generalized): Secondary | ICD-10-CM | POA: Diagnosis not present

## 2019-05-19 DIAGNOSIS — M25611 Stiffness of right shoulder, not elsewhere classified: Secondary | ICD-10-CM | POA: Diagnosis not present

## 2019-05-19 DIAGNOSIS — M7501 Adhesive capsulitis of right shoulder: Secondary | ICD-10-CM | POA: Diagnosis not present

## 2019-05-19 DIAGNOSIS — M6281 Muscle weakness (generalized): Secondary | ICD-10-CM | POA: Diagnosis not present

## 2019-05-19 DIAGNOSIS — S46111D Strain of muscle, fascia and tendon of long head of biceps, right arm, subsequent encounter: Secondary | ICD-10-CM | POA: Diagnosis not present

## 2019-05-25 DIAGNOSIS — M7501 Adhesive capsulitis of right shoulder: Secondary | ICD-10-CM | POA: Diagnosis not present

## 2019-05-25 DIAGNOSIS — M25611 Stiffness of right shoulder, not elsewhere classified: Secondary | ICD-10-CM | POA: Diagnosis not present

## 2019-05-25 DIAGNOSIS — S46111D Strain of muscle, fascia and tendon of long head of biceps, right arm, subsequent encounter: Secondary | ICD-10-CM | POA: Diagnosis not present

## 2019-05-25 DIAGNOSIS — M6281 Muscle weakness (generalized): Secondary | ICD-10-CM | POA: Diagnosis not present

## 2019-06-03 DIAGNOSIS — M7501 Adhesive capsulitis of right shoulder: Secondary | ICD-10-CM | POA: Diagnosis not present

## 2019-06-03 DIAGNOSIS — M25611 Stiffness of right shoulder, not elsewhere classified: Secondary | ICD-10-CM | POA: Diagnosis not present

## 2019-06-03 DIAGNOSIS — M6281 Muscle weakness (generalized): Secondary | ICD-10-CM | POA: Diagnosis not present

## 2019-06-03 DIAGNOSIS — S46111D Strain of muscle, fascia and tendon of long head of biceps, right arm, subsequent encounter: Secondary | ICD-10-CM | POA: Diagnosis not present

## 2019-06-14 ENCOUNTER — Other Ambulatory Visit: Payer: Self-pay

## 2019-06-15 ENCOUNTER — Encounter: Payer: Self-pay | Admitting: Obstetrics & Gynecology

## 2019-06-15 ENCOUNTER — Ambulatory Visit (INDEPENDENT_AMBULATORY_CARE_PROVIDER_SITE_OTHER): Payer: No Typology Code available for payment source | Admitting: Obstetrics & Gynecology

## 2019-06-15 VITALS — BP 124/80 | Ht 64.0 in | Wt 160.0 lb

## 2019-06-15 DIAGNOSIS — Z01419 Encounter for gynecological examination (general) (routine) without abnormal findings: Secondary | ICD-10-CM | POA: Diagnosis not present

## 2019-06-15 DIAGNOSIS — Z780 Asymptomatic menopausal state: Secondary | ICD-10-CM

## 2019-06-15 NOTE — Patient Instructions (Signed)
1. Encounter for routine gynecological examination with Papanicolaou smear of cervix Normal gynecologic exam in menopause.  Pap reflex done today.  Breast exam normal.  Patient had right shoulder surgery.  Will schedule her screening mammogram this fall after completing physical therapy to regain her range of motion.  Health labs with family physician.  Body mass index 27.46.  Continue with fitness and healthy nutrition.  2. Postmenopausal Well on no hormone replacement therapy.  No postmenopausal bleeding.  Recommend vitamin D supplements, calcium intake of 1200 mg daily and regular weightbearing physical activities.  Recommend a vitamin D level at her health lab visit with her family physician.  Kimberly Vargas, it was a pleasure seeing you today!  I will inform you of your results as soon as they are available.

## 2019-06-15 NOTE — Addendum Note (Signed)
Addended by: Gae Gallop T on: 06/15/2019 04:30 PM   Modules accepted: Orders

## 2019-06-15 NOTE — Progress Notes (Signed)
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  RP:  Established patient presenting for annual gyn exam   HPI:  Menopause x2015 at age 68 confirmed with high FSH.  Well tolerated without HRT.  No PMB.  No Pelvic Pain.  Abstinent currently.  Breast normal.  Urine and bowel movements normal.  On vitamin D supplements and exercising regularly.  BMI 27.46. Health labs with Fam MD.  Past medical history,surgical history, family history and social history were all reviewed and documented in the EPIC chart.  Gynecologic History Patient's last menstrual period was 08/17/2013.  Obstetric History OB History  Gravida Para Term Preterm AB Living  3 2   2 1 2   SAB TAB Ectopic Multiple Live Births      1        # Outcome Date GA Lbr Len/2nd Weight Sex Delivery Anes PTL Lv  3 Ectopic           2 Preterm           1 Preterm              ROS: A ROS was performed and pertinent positives and negatives are included in the history.  GENERAL: No fevers or chills. HEENT: No change in vision, no earache, sore throat or sinus congestion. NECK: No pain or stiffness. CARDIOVASCULAR: No chest pain or pressure. No palpitations. PULMONARY: No shortness of breath, cough or wheeze. GASTROINTESTINAL: No abdominal pain, nausea, vomiting or diarrhea, melena or bright red blood per rectum. GENITOURINARY: No urinary frequency, urgency, hesitancy or dysuria. MUSCULOSKELETAL: No joint or muscle pain, no back pain, no recent trauma. DERMATOLOGIC: No rash, no itching, no lesions. ENDOCRINE: No polyuria, polydipsia, no heat or cold intolerance. No recent change in weight. HEMATOLOGICAL: No anemia or easy bruising or bleeding. NEUROLOGIC: No headache, seizures, numbness, tingling or weakness. PSYCHIATRIC: No depression, no loss of interest in normal activity or change in sleep pattern.     Exam:   BP 124/80 (BP Location: Right  Arm, Patient Position: Sitting, Cuff Size: Normal)   Ht 5\' 4"  (1.626 m)   Wt 160 lb (72.6 kg)   LMP 08/17/2013   BMI 27.46 kg/m   Body mass index is 27.46 kg/m.  General appearance : Well developed well nourished female. No acute distress HEENT: Eyes: no retinal hemorrhage or exudates,  Neck supple, trachea midline, no carotid bruits, no thyroidmegaly Lungs: Clear to auscultation, no rhonchi or wheezes, or rib retractions  Heart: Regular rate and rhythm, no murmurs or gallops Breast:Examined in sitting and supine position were symmetrical in appearance, no palpable masses or tenderness,  no skin retraction, no nipple inversion, no nipple discharge, no skin discoloration, no axillary or supraclavicular lymphadenopathy Abdomen: no palpable masses or tenderness, no rebound or guarding Extremities: no edema or skin discoloration or tenderness  Pelvic: Vulva: Normal             Vagina: No gross lesions or discharge  Cervix: No gross lesions or discharge.  Pap reflex done.  Uterus  AV, normal size, shape and consistency, non-tender and mobile  Adnexa  Without masses or tenderness  Anus: Normal   Assessment/Plan:  49 y.o. female for annual exam   1. Encounter for routine gynecological examination with Papanicolaou smear of cervix Normal gynecologic exam in menopause.  Pap reflex done today.  Breast exam normal.  Patient had right shoulder  surgery.  Will schedule her screening mammogram this fall after completing physical therapy to regain her range of motion.  Health labs with family physician.  Body mass index 27.46.  Continue with fitness and healthy nutrition.  2. Postmenopausal Well on no hormone replacement therapy.  No postmenopausal bleeding.  Recommend vitamin D supplements, calcium intake of 1200 mg daily and regular weightbearing physical activities.  Recommend a vitamin D level at her health lab visit with her family physician.  Princess Bruins MD, 3:43 PM 06/15/2019

## 2019-06-16 LAB — PAP IG W/ RFLX HPV ASCU

## 2019-08-04 DIAGNOSIS — M25611 Stiffness of right shoulder, not elsewhere classified: Secondary | ICD-10-CM | POA: Diagnosis not present

## 2019-08-27 DIAGNOSIS — H52203 Unspecified astigmatism, bilateral: Secondary | ICD-10-CM | POA: Diagnosis not present

## 2019-08-27 DIAGNOSIS — H5213 Myopia, bilateral: Secondary | ICD-10-CM | POA: Diagnosis not present

## 2019-08-27 DIAGNOSIS — H25013 Cortical age-related cataract, bilateral: Secondary | ICD-10-CM | POA: Diagnosis not present

## 2019-08-31 ENCOUNTER — Ambulatory Visit: Payer: Self-pay | Attending: Internal Medicine

## 2019-08-31 DIAGNOSIS — Z23 Encounter for immunization: Secondary | ICD-10-CM

## 2019-08-31 NOTE — Progress Notes (Signed)
   Covid-19 Vaccination Clinic  Name:  Kimberly Vargas    MRN: [id number] DOB: 07-17-1970  08/31/2019  [name] was observed post Covid-19 immunization for 15 minutes without incident. She was provided with Vaccine Information Sheet and instruction to access the V-Safe system.   [name] was instructed to call 911 with any severe reactions post vaccine: Marland Kitchen Difficulty breathing  . Swelling of face and throat  . A fast heartbeat  . A bad rash all over body  . Dizziness and weakness   Immunizations Administered    Name Date Dose VIS Date Route   Pfizer COVID-19 Vaccine 08/31/2019  8:35 AM 0.3 mL 06/16/2018 Intramuscular   Manufacturer: [place]   Lot: [id number]   [place]: [id number]

## 2019-09-21 ENCOUNTER — Ambulatory Visit: Payer: Self-pay | Attending: Internal Medicine

## 2019-09-21 DIAGNOSIS — Z23 Encounter for immunization: Secondary | ICD-10-CM

## 2019-09-21 NOTE — Progress Notes (Signed)
   Covid-19 Vaccination Clinic  Name:  Kimberly Vargas    MRN: [id number] DOB: 1971/03/24  09/21/2019  [name] was observed post Covid-19 immunization for 15 minutes without incident. She was provided with Vaccine Information Sheet and instruction to access the V-Safe system.   [name] was instructed to call 911 with any severe reactions post vaccine: Marland Kitchen Difficulty breathing  . Swelling of face and throat  . A fast heartbeat  . A bad rash all over body  . Dizziness and weakness   Immunizations Administered    Name Date Dose VIS Date Route   Pfizer COVID-19 Vaccine 09/21/2019  8:32 AM 0.3 mL 06/16/2018 Intramuscular   Manufacturer: [place]   Lot: [id number]   [place]: [id number]

## 2020-06-07 DIAGNOSIS — Z20822 Contact with and (suspected) exposure to covid-19: Secondary | ICD-10-CM | POA: Diagnosis not present

## 2020-07-04 DIAGNOSIS — T881XXA Other complications following immunization, not elsewhere classified, initial encounter: Secondary | ICD-10-CM | POA: Diagnosis not present

## 2020-07-11 ENCOUNTER — Other Ambulatory Visit (HOSPITAL_COMMUNITY): Payer: Self-pay | Admitting: Gastroenterology

## 2020-07-11 DIAGNOSIS — R1011 Right upper quadrant pain: Secondary | ICD-10-CM

## 2020-07-11 DIAGNOSIS — Z1211 Encounter for screening for malignant neoplasm of colon: Secondary | ICD-10-CM | POA: Diagnosis not present

## 2020-07-11 DIAGNOSIS — R1013 Epigastric pain: Secondary | ICD-10-CM | POA: Diagnosis not present

## 2020-07-11 DIAGNOSIS — R110 Nausea: Secondary | ICD-10-CM | POA: Diagnosis not present

## 2020-07-19 DIAGNOSIS — R1013 Epigastric pain: Secondary | ICD-10-CM | POA: Diagnosis not present

## 2020-07-19 DIAGNOSIS — K2282 Esophagogastric junction polyp: Secondary | ICD-10-CM | POA: Diagnosis not present

## 2020-07-19 DIAGNOSIS — K2950 Unspecified chronic gastritis without bleeding: Secondary | ICD-10-CM | POA: Diagnosis not present

## 2020-07-19 DIAGNOSIS — Z1211 Encounter for screening for malignant neoplasm of colon: Secondary | ICD-10-CM | POA: Diagnosis not present

## 2020-07-19 DIAGNOSIS — K2970 Gastritis, unspecified, without bleeding: Secondary | ICD-10-CM | POA: Diagnosis not present

## 2020-07-19 DIAGNOSIS — K3189 Other diseases of stomach and duodenum: Secondary | ICD-10-CM | POA: Diagnosis not present

## 2020-07-19 DIAGNOSIS — K317 Polyp of stomach and duodenum: Secondary | ICD-10-CM | POA: Diagnosis not present

## 2020-07-19 DIAGNOSIS — K319 Disease of stomach and duodenum, unspecified: Secondary | ICD-10-CM | POA: Diagnosis not present

## 2020-07-24 ENCOUNTER — Other Ambulatory Visit: Payer: Self-pay

## 2020-07-24 ENCOUNTER — Ambulatory Visit (HOSPITAL_COMMUNITY)
Admission: RE | Admit: 2020-07-24 | Discharge: 2020-07-24 | Disposition: A | Discharge status: Home / Self Care | Payer: 59 | Source: Ambulatory Visit | Attending: Gastroenterology | Admitting: Gastroenterology

## 2020-07-24 DIAGNOSIS — R1011 Right upper quadrant pain: Secondary | ICD-10-CM | POA: Diagnosis not present

## 2020-07-27 ENCOUNTER — Other Ambulatory Visit: Payer: Self-pay | Admitting: Obstetrics & Gynecology

## 2020-07-27 DIAGNOSIS — Z1231 Encounter for screening mammogram for malignant neoplasm of breast: Secondary | ICD-10-CM

## 2020-08-03 ENCOUNTER — Encounter (HOSPITAL_COMMUNITY)
Admission: RE | Admit: 2020-08-03 | Discharge: 2020-08-03 | Disposition: A | Discharge status: Home / Self Care | Payer: 59 | Source: Ambulatory Visit | Attending: Gastroenterology | Admitting: Gastroenterology

## 2020-08-03 ENCOUNTER — Other Ambulatory Visit: Payer: Self-pay

## 2020-08-03 DIAGNOSIS — R1011 Right upper quadrant pain: Secondary | ICD-10-CM | POA: Diagnosis not present

## 2020-08-03 DIAGNOSIS — R110 Nausea: Secondary | ICD-10-CM | POA: Diagnosis not present

## 2020-08-03 DIAGNOSIS — R1013 Epigastric pain: Secondary | ICD-10-CM | POA: Diagnosis not present

## 2020-08-03 MED ORDER — TECHNETIUM TC 99M MEBROFENIN IV KIT
5.2700 | PACK | Freq: Once | INTRAVENOUS | Status: AC | PRN
  Administered 2020-08-03: 5.27 via INTRAVENOUS

## 2020-08-17 DIAGNOSIS — R1033 Periumbilical pain: Secondary | ICD-10-CM | POA: Diagnosis not present

## 2020-08-17 DIAGNOSIS — R140 Abdominal distension (gaseous): Secondary | ICD-10-CM | POA: Diagnosis not present

## 2020-08-17 DIAGNOSIS — R110 Nausea: Secondary | ICD-10-CM | POA: Diagnosis not present

## 2020-08-17 DIAGNOSIS — R1013 Epigastric pain: Secondary | ICD-10-CM | POA: Diagnosis not present

## 2020-08-17 DIAGNOSIS — K449 Diaphragmatic hernia without obstruction or gangrene: Secondary | ICD-10-CM | POA: Diagnosis not present

## 2020-08-18 ENCOUNTER — Other Ambulatory Visit: Payer: Self-pay | Admitting: Gastroenterology

## 2020-08-18 DIAGNOSIS — R1013 Epigastric pain: Secondary | ICD-10-CM

## 2020-09-01 ENCOUNTER — Other Ambulatory Visit: Payer: Self-pay

## 2020-09-01 ENCOUNTER — Ambulatory Visit
Admission: RE | Admit: 2020-09-01 | Discharge: 2020-09-01 | Disposition: A | Discharge status: Home / Self Care | Payer: 59 | Source: Ambulatory Visit | Attending: Gastroenterology | Admitting: Gastroenterology

## 2020-09-01 DIAGNOSIS — K760 Fatty (change of) liver, not elsewhere classified: Secondary | ICD-10-CM | POA: Diagnosis not present

## 2020-09-01 DIAGNOSIS — K429 Umbilical hernia without obstruction or gangrene: Secondary | ICD-10-CM | POA: Diagnosis not present

## 2020-09-01 DIAGNOSIS — K6389 Other specified diseases of intestine: Secondary | ICD-10-CM | POA: Diagnosis not present

## 2020-09-01 DIAGNOSIS — K3189 Other diseases of stomach and duodenum: Secondary | ICD-10-CM | POA: Diagnosis not present

## 2020-09-01 DIAGNOSIS — R1013 Epigastric pain: Secondary | ICD-10-CM

## 2020-09-01 MED ORDER — IOPAMIDOL (ISOVUE-300) INJECTION 61%
100.0000 mL | Freq: Once | INTRAVENOUS | Status: AC | PRN
  Administered 2020-09-01: 100 mL via INTRAVENOUS

## 2020-09-08 ENCOUNTER — Ambulatory Visit (INDEPENDENT_AMBULATORY_CARE_PROVIDER_SITE_OTHER): Payer: No Typology Code available for payment source | Admitting: Obstetrics & Gynecology

## 2020-09-08 ENCOUNTER — Other Ambulatory Visit: Payer: Self-pay

## 2020-09-08 ENCOUNTER — Other Ambulatory Visit: Payer: Self-pay | Admitting: Gastroenterology

## 2020-09-08 ENCOUNTER — Encounter: Payer: Self-pay | Admitting: Obstetrics & Gynecology

## 2020-09-08 VITALS — BP 120/70 | Ht 63.5 in | Wt 157.0 lb

## 2020-09-08 DIAGNOSIS — R9389 Abnormal findings on diagnostic imaging of other specified body structures: Secondary | ICD-10-CM

## 2020-09-08 DIAGNOSIS — D219 Benign neoplasm of connective and other soft tissue, unspecified: Secondary | ICD-10-CM | POA: Diagnosis not present

## 2020-09-08 DIAGNOSIS — Z780 Asymptomatic menopausal state: Secondary | ICD-10-CM

## 2020-09-08 DIAGNOSIS — Z01419 Encounter for gynecological examination (general) (routine) without abnormal findings: Secondary | ICD-10-CM | POA: Diagnosis not present

## 2020-09-08 NOTE — Progress Notes (Signed)
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  RP: Established patient presentingfor annual gyn exam   HPI: Postmenopause x2015 at age 70 confirmed with high FSH. Well tolerated without HRT. No PMB. No Pelvic Pain. Abstinent currently. Breast normal.  Scheduled for mammo next week.Urine normal.  Bowel movements normal, but abdominal discomfort and pain recurred x 3 months.  Under investigation with Dr Collene Mares.  CT abdo-pelvis 09/01/2020 showed a probable degenerated IM Fibroid measured at 4.3 cm, Ovaries normal. On vitamin D supplements and exercising regularly.  BMI 27.38. Health labs with Fam MD. Col[place] with Dr Collene Mares.  Past medical history,surgical history, family history and social history were all reviewed and documented in the EPIC chart.  Gynecologic History Patient's last menstrual period was 08/17/2013.  Obstetric History OB History  Gravida Para Term Preterm AB Living  3 2   2 1 2   SAB IAB Ectopic Multiple Live Births      1        # Outcome Date GA Lbr Len/2nd Weight Sex Delivery Anes PTL Lv  3 Ectopic           2 Preterm           1 Preterm              ROS: A ROS was performed and pertinent positives and negatives are included in the history.  GENERAL: No fevers or chills. HEENT: No change in vision, no earache, sore throat or sinus congestion. NECK: No pain or stiffness. CARDIOVASCULAR: No chest pain or pressure. No palpitations. PULMONARY: No shortness of breath, cough or wheeze. GASTROINTESTINAL: No abdominal pain, nausea, vomiting or diarrhea, melena or bright red blood per rectum. GENITOURINARY: No urinary frequency, urgency, hesitancy or dysuria. MUSCULOSKELETAL: No joint or muscle pain, no back pain, no recent trauma. DERMATOLOGIC: No rash, no itching, no lesions. ENDOCRINE: No polyuria, polydipsia, no heat or cold intolerance. No recent change in weight.  HEMATOLOGICAL: No anemia or easy bruising or bleeding. NEUROLOGIC: No headache, seizures, numbness, tingling or weakness. PSYCHIATRIC: No depression, no loss of interest in normal activity or change in sleep pattern.     Exam:   BP 120/70   Ht 5' 3.5" (1.613 m)   Wt 157 lb (71.2 kg)   LMP 08/17/2013   BMI 27.38 kg/m   Body mass index is 27.38 kg/m.  General appearance : Well developed well nourished female. No acute distress HEENT: Eyes: no retinal hemorrhage or exudates,  Neck supple, trachea midline, no carotid bruits, no thyroidmegaly Lungs: Clear to auscultation, no rhonchi or wheezes, or rib retractions  Heart: Regular rate and rhythm, no murmurs or gallops Breast:Examined in sitting and supine position were symmetrical in appearance, no palpable masses or tenderness,  no skin retraction, no nipple inversion, no nipple discharge, no skin discoloration, no axillary or supraclavicular lymphadenopathy Abdomen: no palpable masses or tenderness, no rebound or guarding Extremities: no edema or skin discoloration or tenderness  Pelvic: Vulva: Normal             Vagina: No gross lesions or discharge  Cervix: No gross lesions or discharge  Uterus  AV, normal size, shape and consistency, non-tender and mobile  Adnexa  Without masses or tenderness  Anus: Normal   Assessment/Plan:  50 y.o. female for annual exam   1. Well female exam with routine gynecological exam Gynecologic exam with mildly nodular  uterus.  Pap test - February 2021, no indication to repeat a Pap test this year.  Breast exam normal.  Screening mammogram scheduled for next week.  Colonoscopy with Dr. Collene Mares.  Undergoing investigation for abdominal discomfort.  Health labs with family physician.  Body mass index 27.38.  Continue with fitness and healthy nutrition.  2. Postmenopausal Well on no hormone replacement therapy.  No postmenopausal bleeding.  Vitamin D supplements, calcium intake of 1.5 g/day total and regular  weightbearing physical activities.  3. Fibroid CT scan of abdomen and pelvis done for gastrointestinal investigation revealed a probable intramural fibroid measured at 4.3 cm with degeneration.  Ovaries were normal.  Patient had an ultrasound in 2014 showing 2 fibroids.  We will complete the investigation with a pelvic ultrasound at follow-up. - US Transvaginal Non-OB; Future  Other orders - pantoprazole (PROTONIX) 40 MG tablet; Take 40 mg by mouth daily.  Princess Bruins MD, 10:18 AM 09/08/2020

## 2020-09-15 ENCOUNTER — Ambulatory Visit
Admission: RE | Admit: 2020-09-15 | Discharge: 2020-09-15 | Disposition: A | Discharge status: Home / Self Care | Payer: 59 | Source: Ambulatory Visit

## 2020-09-15 ENCOUNTER — Other Ambulatory Visit: Payer: Self-pay

## 2020-09-15 DIAGNOSIS — Z1231 Encounter for screening mammogram for malignant neoplasm of breast: Secondary | ICD-10-CM | POA: Diagnosis not present

## 2020-09-22 ENCOUNTER — Other Ambulatory Visit: Payer: 59

## 2020-09-28 ENCOUNTER — Encounter: Payer: Self-pay | Admitting: Obstetrics & Gynecology

## 2020-09-28 ENCOUNTER — Ambulatory Visit (INDEPENDENT_AMBULATORY_CARE_PROVIDER_SITE_OTHER): Payer: No Typology Code available for payment source

## 2020-09-28 ENCOUNTER — Other Ambulatory Visit: Payer: Self-pay

## 2020-09-28 ENCOUNTER — Ambulatory Visit: Payer: No Typology Code available for payment source | Admitting: Obstetrics & Gynecology

## 2020-09-28 VITALS — BP 120/80

## 2020-09-28 DIAGNOSIS — N854 Malposition of uterus: Secondary | ICD-10-CM

## 2020-09-28 DIAGNOSIS — D251 Intramural leiomyoma of uterus: Secondary | ICD-10-CM

## 2020-09-28 DIAGNOSIS — D219 Benign neoplasm of connective and other soft tissue, unspecified: Secondary | ICD-10-CM

## 2020-09-28 DIAGNOSIS — R1013 Epigastric pain: Secondary | ICD-10-CM | POA: Diagnosis not present

## 2020-09-28 NOTE — Progress Notes (Signed)
    Kimberly Vargas [birth year] [id number]        50 y.o.  [id number]   RP: Fibroid on CT scan for Pelvic US assessment  HPI: Discomfort and pain in the epigastric area mostly post prandial now. Abdominal pain x about 4 months.  Under investigation with Dr Collene Mares.  CT abdo-pelvis 09/01/2020 showed a probable degenerated IM Fibroid measured at 4.3 cm, Ovaries normal. CT scan of the upper abdomen for liver and stomach scheduled next week thru Dr Collene Mares.  Postmenopause x 2015 at age 50 confirmed with high FSH.  Well tolerated without HRT.  No PMB.  No Pelvic Pain.  Abstinent currently.  Bowel movements normal.    OB History  Gravida Para Term Preterm AB Living  3 2   2 1 2   SAB IAB Ectopic Multiple Live Births      1        # Outcome Date GA Lbr Len/2nd Weight Sex Delivery Anes PTL Lv  3 Ectopic           2 Preterm           1 Preterm             Past medical history,surgical history, problem list, medications, allergies, family history and social history were all reviewed and documented in the EPIC chart.   Directed ROS with pertinent positives and negatives documented in the history of present illness/assessment and plan.  Exam:  Vitals:   09/28/20 0950  BP: 120/80   General appearance:  Normal  Pelvic US today: T/V images.  Retroverted uterus mildly enlarged with a single intramural fibroid measured at 4.3 cm at the anterior left side of the uterus.  The overall uterine size is measured at 5.27 x 5.27 x 5.02 cm.  The endometrial lining is thin and symmetrical with no mass or thickening seen measured at 2.3 mm.  Both ovaries are normal in size.  No adnexal mass.  No free fluid in the posterior cul-de-sac.   Assessment/Plan:  50 y.o. [id number]   1. Fibroid Pelvic ultrasound findings thoroughly reviewed with patient.  Patient reassured that the ultrasound confirms a single intramural fibroid measured at 4.3 cm.  Otherwise completely normal uterus and normal bilateral ovaries.  No adnexal  mass.  No free fluid.  The origin of patient's pain is probably gastrointestinal.  Investigation is ongoing with Dr. Collene Mares with a CT scan of the stomach and liver scheduled next week.  2. Epigastric pain  Continue investigation with Dr. Collene Mares.  CT scan of the stomach and liver next week.  Princess Bruins MD, 10:07 AM 09/28/2020

## 2020-10-05 ENCOUNTER — Ambulatory Visit
Admission: RE | Admit: 2020-10-05 | Discharge: 2020-10-05 | Disposition: A | Discharge status: Home / Self Care | Payer: 59 | Source: Ambulatory Visit | Attending: Gastroenterology | Admitting: Gastroenterology

## 2020-10-05 ENCOUNTER — Other Ambulatory Visit: Payer: Self-pay

## 2020-10-05 DIAGNOSIS — R1010 Upper abdominal pain, unspecified: Secondary | ICD-10-CM | POA: Diagnosis not present

## 2020-10-05 DIAGNOSIS — R9389 Abnormal findings on diagnostic imaging of other specified body structures: Secondary | ICD-10-CM

## 2020-10-05 MED ORDER — IOPAMIDOL (ISOVUE-370) INJECTION 76%
80.0000 mL | Freq: Once | INTRAVENOUS | Status: AC | PRN
  Administered 2020-10-05: 80 mL via INTRAVENOUS

## 2020-10-09 ENCOUNTER — Emergency Department (HOSPITAL_BASED_OUTPATIENT_CLINIC_OR_DEPARTMENT_OTHER): Payer: 59

## 2020-10-09 ENCOUNTER — Emergency Department (HOSPITAL_BASED_OUTPATIENT_CLINIC_OR_DEPARTMENT_OTHER)
Admission: EM | Admit: 2020-10-09 | Discharge: 2020-10-09 | Disposition: A | Discharge status: Home / Self Care | Payer: 59 | Attending: Emergency Medicine | Admitting: Emergency Medicine

## 2020-10-09 ENCOUNTER — Other Ambulatory Visit: Payer: Self-pay

## 2020-10-09 ENCOUNTER — Encounter (HOSPITAL_BASED_OUTPATIENT_CLINIC_OR_DEPARTMENT_OTHER): Payer: Self-pay | Admitting: Obstetrics and Gynecology

## 2020-10-09 DIAGNOSIS — Z7901 Long term (current) use of anticoagulants: Secondary | ICD-10-CM | POA: Diagnosis not present

## 2020-10-09 DIAGNOSIS — R1010 Upper abdominal pain, unspecified: Secondary | ICD-10-CM | POA: Diagnosis not present

## 2020-10-09 DIAGNOSIS — R609 Edema, unspecified: Secondary | ICD-10-CM

## 2020-10-09 DIAGNOSIS — I808 Phlebitis and thrombophlebitis of other sites: Secondary | ICD-10-CM | POA: Diagnosis not present

## 2020-10-09 DIAGNOSIS — R2231 Localized swelling, mass and lump, right upper limb: Secondary | ICD-10-CM | POA: Diagnosis not present

## 2020-10-09 DIAGNOSIS — I82611 Acute embolism and thrombosis of superficial veins of right upper extremity: Secondary | ICD-10-CM | POA: Diagnosis not present

## 2020-10-09 MED ORDER — APIXABAN (ELIQUIS) VTE STARTER PACK (10MG AND 5MG): ORAL_TABLET | ORAL | 0 refills | Status: DC

## 2020-10-09 NOTE — ED Provider Notes (Signed)
[facility] DEPT Provider Note   CSN: [id number] Arrival date & time: 10/09/20  1615     History Chief Complaint  Patient presents with   Rash    Kimberly Vargas is a 50 y.o. female.   Rash Associated symptoms: no shortness of breath    Patient been dealing upper abdominal pain for a while now.  Had CT scan Vargas 3 days ago with IV placed in right antecubital area.  States it was burning with the infusion.  Then developed more swelling and pain.  States there is swelling over the deltoid.  No chest pain.  No trouble breathing.  States there was erythema that is now improving.    Past Medical History:  Diagnosis Date   Anemia    Ectopic pregnancy 2001   treated with methotrexate   NSVD (normal spontaneous vaginal delivery)    PROM (premature rupture of membranes)    DELIVERY AT 34 WKS    Patient Active Problem List   Diagnosis Date Noted   Premature ovarian failure 03/20/2016   Low TSH level 10/28/2014   Hot flushes, perimenopausal 08/25/2014   Squeezing chest pain 04/23/2012    Class: Acute   Varicose veins of lower extremities with other complications 87/56/4332   Swelling of limb 11/25/2011   Leg pain 11/25/2011    Past Surgical History:  Procedure Laterality Date   DILATION AND CURETTAGE OF UTERUS     RESECTOSCOPIC POLYPECTOMY  12/25/2005   SHOULDER SURGERY Right 02/2019     OB History     Gravida  3   Para  2   Term      Preterm  2   AB  1   Living  2      SAB      IAB      Ectopic  1   Multiple      Live Births              Family History  Problem Relation Age of Onset   Cancer Maternal Grandmother        OVARIAN   Hypertension Father    Heart disease Father        before age 19   Diabetes Father    Hyperlipidemia Father    Heart attack Father    Hypertension Sister    Diabetes Paternal Grandmother    Other Mother        varicose veins    Social History   Tobacco Use   Smoking status: Never    Smokeless tobacco: Never  Vaping Use   Vaping Use: Never used  Substance Use Topics   Alcohol use: No    Alcohol/week: 0.0 standard drinks   Drug use: No    Home Medications Prior to Admission medications   Medication Sig Start Date End Date Taking? Authorizing Provider  APIXABAN Arne Cleveland) VTE STARTER PACK (10MG  AND 5MG ) Take as directed on package: start with two-5mg  tablets twice daily for 7 days. On day 8, switch to one-5mg  tablet twice daily. 10/09/20  Yes Davonna Belling, MD  Calcium Carb-Cholecalciferol (CALCIUM 1000 + D PO) Take by mouth.    [provider]  Multiple Vitamins-Minerals (MULTIVITAMIN PO) Take 1 tablet by mouth daily.    [provider]  Omega-3 Fatty Acids (FISH OIL PO) Take 1 tablet by mouth daily.    [provider]  pantoprazole (PROTONIX) 40 MG tablet Take 40 mg by mouth daily.    [provider]  Probiotic Product (PROBIOTIC-10 PO) Take by mouth.    [provider]    Allergies    Patient has no known allergies.  Review of Systems   Review of Systems  Constitutional:  Negative for appetite change.  HENT:  Negative for congestion.   Respiratory:  Negative for shortness of breath.   Cardiovascular:  Negative for chest pain.  Gastrointestinal:  Negative for abdominal distention.  Genitourinary:  Negative for flank pain.  Musculoskeletal:  Negative for back pain.       Right upper extremity tenderness.  Skin:  Positive for rash.  Neurological:  Negative for syncope and weakness.  Psychiatric/Behavioral:  Negative for confusion.    Physical Exam Updated Vital Signs BP 105/69   Pulse 67   Temp 98 F (36.7 C) (Oral)   Resp 17   LMP 08/17/2013   SpO2 99%   Physical Exam Vitals and nursing note reviewed.  HENT:     Head: Atraumatic.  Eyes:     Pupils: Pupils are equal, round, and reactive to light.  Cardiovascular:     Rate and Rhythm: Regular rhythm.  Pulmonary:     Breath sounds: No wheezing  or rhonchi.  Abdominal:     Tenderness: There is no abdominal tenderness.  Musculoskeletal:     Cervical back: Neck supple.     Comments: Tenderness over superficial vein on anterior right bicep.  Approximately 5 cm long.  Can feel the proximal end of it which ends near the middle of the bicep.  Also can feel the distal end.  Neurovascular intact right hand.  Strong radial pulse.  May be slight erythema over the vein.  Neurological:     General: No focal deficit present.     Mental Status: She is alert.    ED Results / Procedures / Treatments   Labs (all labs ordered are listed, but only abnormal results are displayed) Labs Reviewed - No data to display  EKG None  Radiology US Venous Img Upper Uni Right(DVT)  Result Date: 10/09/2020 CLINICAL DATA:  Upper extremity edema status post CT EXAM: Right UPPER EXTREMITY VENOUS DOPPLER ULTRASOUND TECHNIQUE: Gray-scale sonography with graded compression, as well as color Doppler and duplex ultrasound were performed to evaluate the upper extremity deep venous system from the level of the subclavian vein and including the jugular, axillary, basilic, radial, ulnar and upper cephalic vein. Spectral Doppler was utilized to evaluate flow at rest and with distal augmentation maneuvers. COMPARISON:  None. FINDINGS: Contralateral Subclavian Vein: Respiratory phasicity is normal and symmetric with the symptomatic side. No evidence of thrombus. Normal compressibility. Internal Jugular Vein: No evidence of thrombus. Normal compressibility, respiratory phasicity and response to augmentation. Subclavian Vein: No evidence of thrombus. Normal compressibility, respiratory phasicity and response to augmentation. Axillary Vein: No evidence of thrombus. Normal compressibility, respiratory phasicity and response to augmentation. Cephalic Vein: Occlusive thrombus extending from antecubital fossa to the shoulder. Basilic Vein: No evidence of thrombus. Normal compressibility,  respiratory phasicity and response to augmentation. Brachial Veins: No evidence of thrombus. Normal compressibility, respiratory phasicity and response to augmentation. Radial Veins: No evidence of thrombus.  Normal compressibility Ulnar Veins: No evidence of thrombus.  Normal compressibility IMPRESSION: No evidence of DVT within the right upper extremity. Positive for acute superficial vein thrombosis the cephalic vein from the antecubital fossa to the shoulder. Electronically Signed   By: Donavan Foil M.D.   On: 10/09/2020 18:41    Procedures Procedures   Medications Ordered in ED Medications -  No data to display  ED Course  I have reviewed the triage vital signs and the nursing notes.  Pertinent labs & imaging results that were available during my care of the patient were reviewed by me and considered in my medical decision making (see chart for details).    MDM Rules/Calculators/A&P                          Patient with swelling in her right upper arm after possible infiltration of IV with CT contrast.  Appears to be a cord in the upper arm.  Ultrasound Vargas to determine extent of it.  No DVT but SVT goes from antecubital area up to shoulder.  Discussed with Dr. Oneida Alar.  After discussion we decided she would benefit from a month of Eliquis.  Does not necessarily need vascular follow-up as the month treatment would be enough.  Watch for worsening symptoms.  Doubt pulmonary embolism.  No chest pain.  No trouble breathing.  Does not appear infected.  Discharge home Final Clinical Impression(s) / ED Diagnoses Final diagnoses:  Swelling  Superficial thrombophlebitis of right upper extremity    Rx / DC Orders ED Discharge Orders          Ordered    APIXABAN (ELIQUIS) VTE STARTER PACK (10MG  AND 5MG )        10/09/20 1926             Davonna Belling, MD 10/09/20 1948

## 2020-10-09 NOTE — ED Triage Notes (Signed)
Patient reports her right arm is painful and she has a rash. Patient states she had a CT scan done with contrast in the right [facility]. Patient reports during the CT she felt significant pain and heat. Patient reports she was told to come get it checked.

## 2020-10-09 NOTE — Discharge Instructions (Addendum)
Take the blood thinner for a month.  Follow-up with your primary care doctor.  Watch for increased pain or swelling in the arm chest pain or trouble breathing.

## 2020-10-11 ENCOUNTER — Ambulatory Visit: Payer: No Typology Code available for payment source | Admitting: Obstetrics & Gynecology

## 2020-11-15 DIAGNOSIS — J069 Acute upper respiratory infection, unspecified: Secondary | ICD-10-CM | POA: Diagnosis not present

## 2020-12-04 ENCOUNTER — Telehealth: Payer: Self-pay

## 2020-12-04 MED ORDER — FLUCONAZOLE 150 MG PO TABS: ORAL_TABLET | ORAL | 1 refills | Status: DC

## 2020-12-04 NOTE — Telephone Encounter (Signed)
Patient called to schedule appt but no openings this week and was transferred to triage.  Patient c/o yeast infection sx following 10 days of Amoxicillin. She is having vaginal itching and irritation. She used OTC Monistat cream and it helped a little but sx persist.   Rec?

## 2020-12-04 NOTE — Telephone Encounter (Signed)
Spoke with Dr. Dellis Filbert. She recommended Diflucan 150 mg #3 to take one daily x 3 days and one refill.  I informed patient we will send this Rx but if sx persist after using should be fitted in for appointment.

## 2020-12-19 ENCOUNTER — Other Ambulatory Visit: Payer: Self-pay | Admitting: Physician Assistant

## 2020-12-19 ENCOUNTER — Ambulatory Visit
Admission: RE | Admit: 2020-12-19 | Discharge: 2020-12-19 | Disposition: A | Discharge status: Home / Self Care | Payer: 59 | Source: Ambulatory Visit | Attending: Physician Assistant | Admitting: Physician Assistant

## 2020-12-19 DIAGNOSIS — R200 Anesthesia of skin: Secondary | ICD-10-CM

## 2020-12-19 DIAGNOSIS — M47816 Spondylosis without myelopathy or radiculopathy, lumbar region: Secondary | ICD-10-CM | POA: Diagnosis not present

## 2020-12-19 DIAGNOSIS — M4696 Unspecified inflammatory spondylopathy, lumbar region: Secondary | ICD-10-CM | POA: Diagnosis not present

## 2021-04-03 DIAGNOSIS — I82A12 Acute embolism and thrombosis of left axillary vein: Secondary | ICD-10-CM | POA: Diagnosis not present

## 2021-06-12 DIAGNOSIS — E049 Nontoxic goiter, unspecified: Secondary | ICD-10-CM | POA: Diagnosis not present

## 2021-06-12 DIAGNOSIS — E7800 Pure hypercholesterolemia, unspecified: Secondary | ICD-10-CM | POA: Diagnosis not present

## 2021-06-12 DIAGNOSIS — E039 Hypothyroidism, unspecified: Secondary | ICD-10-CM | POA: Diagnosis not present

## 2021-06-12 DIAGNOSIS — Z0000 Encounter for general adult medical examination without abnormal findings: Secondary | ICD-10-CM | POA: Diagnosis not present

## 2021-06-12 DIAGNOSIS — Z1389 Encounter for screening for other disorder: Secondary | ICD-10-CM | POA: Diagnosis not present

## 2021-06-15 DIAGNOSIS — E049 Nontoxic goiter, unspecified: Secondary | ICD-10-CM | POA: Diagnosis not present

## 2021-06-19 DIAGNOSIS — D72819 Decreased white blood cell count, unspecified: Secondary | ICD-10-CM | POA: Diagnosis not present

## 2021-06-19 DIAGNOSIS — J309 Allergic rhinitis, unspecified: Secondary | ICD-10-CM | POA: Diagnosis not present

## 2021-06-19 DIAGNOSIS — E063 Autoimmune thyroiditis: Secondary | ICD-10-CM | POA: Diagnosis not present

## 2021-06-19 DIAGNOSIS — J019 Acute sinusitis, unspecified: Secondary | ICD-10-CM | POA: Diagnosis not present

## 2021-06-19 DIAGNOSIS — E7800 Pure hypercholesterolemia, unspecified: Secondary | ICD-10-CM | POA: Diagnosis not present

## 2021-07-31 DIAGNOSIS — E7800 Pure hypercholesterolemia, unspecified: Secondary | ICD-10-CM | POA: Diagnosis not present

## 2021-07-31 DIAGNOSIS — E063 Autoimmune thyroiditis: Secondary | ICD-10-CM | POA: Diagnosis not present

## 2022-02-12 ENCOUNTER — Other Ambulatory Visit: Payer: Self-pay | Admitting: Internal Medicine

## 2022-02-12 DIAGNOSIS — Z1231 Encounter for screening mammogram for malignant neoplasm of breast: Secondary | ICD-10-CM

## 2022-02-21 ENCOUNTER — Ambulatory Visit
Admission: RE | Admit: 2022-02-21 | Discharge: 2022-02-21 | Disposition: A | Discharge status: Home / Self Care | Payer: 59 | Source: Ambulatory Visit | Attending: Internal Medicine | Admitting: Internal Medicine

## 2022-02-21 DIAGNOSIS — Z1231 Encounter for screening mammogram for malignant neoplasm of breast: Secondary | ICD-10-CM

## 2022-04-18 ENCOUNTER — Ambulatory Visit: Payer: No Typology Code available for payment source | Admitting: Obstetrics & Gynecology

## 2022-06-02 ENCOUNTER — Emergency Department (HOSPITAL_BASED_OUTPATIENT_CLINIC_OR_DEPARTMENT_OTHER)
Admission: EM | Admit: 2022-06-02 | Discharge: 2022-06-02 | Disposition: A | Discharge status: Home / Self Care | Payer: 59 | Attending: Emergency Medicine | Admitting: Emergency Medicine

## 2022-06-02 ENCOUNTER — Other Ambulatory Visit: Payer: Self-pay

## 2022-06-02 DIAGNOSIS — Z1152 Encounter for screening for COVID-19: Secondary | ICD-10-CM | POA: Insufficient documentation

## 2022-06-02 DIAGNOSIS — Z7901 Long term (current) use of anticoagulants: Secondary | ICD-10-CM | POA: Diagnosis not present

## 2022-06-02 DIAGNOSIS — B349 Viral infection, unspecified: Secondary | ICD-10-CM | POA: Insufficient documentation

## 2022-06-02 DIAGNOSIS — R059 Cough, unspecified: Secondary | ICD-10-CM | POA: Diagnosis present

## 2022-06-02 LAB — RESP PANEL BY RT-PCR (RSV, FLU A&B, COVID)  RVPGX2
Influenza A by PCR: NEGATIVE
Influenza B by PCR: NEGATIVE
Resp Syncytial Virus by PCR: NEGATIVE
SARS Coronavirus 2 by RT PCR: NEGATIVE

## 2022-06-02 NOTE — Discharge Instructions (Addendum)
Please make an appointment with your primary care provider to be seen in the next few days regarding recent ER visit.  You may alternate between 1000 milligrams Tylenol and 400 mg ibuprofen every 6 hours as needed for pain.  You may take cough drops, honey, hot tea for symptom management as well.  Please take in plenty of fluids and have a bland diet.  Symptoms worsen please return to ER.

## 2022-06-02 NOTE — ED Triage Notes (Signed)
Patient arrives with complaints of cough and headache x3 days. Patient did have a sick exposure at home. No vomiting/diarrhea.

## 2022-06-02 NOTE — ED Provider Notes (Signed)
[facility] Provider Note   CSN: [id number] Arrival date & time: 06/02/22  1011     History  Chief Complaint  Patient presents with   Cough   URI    Kimberly Vargas is a 52 y.o. female who presented with cough and URI symptoms that began last Friday.  Patient stated her son's teammate had similar symptoms.  Patient's son also has similar symptoms.  Patient is able to tolerate food and fluids orally.  Patient endorsed generalized weakness and headache all around the head.  Patient denied fevers, chest pain, shortness of breath, abdominal pain, nausea/vomiting, dysuria, neck stiffness, confusion, diarrhea, ear pain     Home Medications Prior to Admission medications   Medication Sig Start Date End Date Taking? Authorizing Provider  APIXABAN (ELIQUIS) VTE STARTER PACK (10MG AND 5MG) Take as directed on package: start with two-46m tablets twice daily for 7 days. On day 8, switch to one-536mtablet twice daily. 10/09/20   PiDavonna BellingMD  Calcium Carb-Cholecalciferol (CALCIUM 1000 + D PO) Take by mouth.    [provider]  fluconazole (DIFLUCAN) 150 MG tablet Take one tab po daily x 3 days. 12/04/20   LaPrincess BruinsMD  Multiple Vitamins-Minerals (MULTIVITAMIN PO) Take 1 tablet by mouth daily.    [provider]  Omega-3 Fatty Acids (FISH OIL PO) Take 1 tablet by mouth daily.    [provider]  pantoprazole (PROTONIX) 40 MG tablet Take 40 mg by mouth daily.    [provider]  Probiotic Product (PROBIOTIC-10 PO) Take by mouth.    [provider]      Allergies    Patient has no known allergies.    Review of Systems   Review of Systems  Respiratory:  Positive for cough.   See HPI  Physical Exam Updated Vital Signs BP 117/78 (BP Location: Left Arm)   Pulse 71   Temp 98 F (36.7 C) (Temporal)   Resp 20   Ht 5' 3"$  (1.6 m)   Wt 72.6 kg   LMP 08/17/2013   SpO2 100%   BMI 28.34  kg/m  Physical Exam HENT:     Right Ear: Tympanic membrane and external ear normal.     Left Ear: Tympanic membrane and external ear normal.     Ears:     Comments: Tympanic membrane bilaterally was intact without erythema/bulging Ear canal was erythematous on both sides without edema    Nose: Congestion and rhinorrhea present.     Mouth/Throat:     Mouth: Mucous membranes are moist.     Pharynx: Posterior oropharyngeal erythema present.  Eyes:     Extraocular Movements: Extraocular movements intact.     Conjunctiva/sclera: Conjunctivae normal.     Pupils: Pupils are equal, round, and reactive to light.  Cardiovascular:     Rate and Rhythm: Normal rate and regular rhythm.     Pulses: Normal pulses.     Heart sounds: Normal heart sounds.  Pulmonary:     Effort: Pulmonary effort is normal. No respiratory distress.     Breath sounds: Normal breath sounds.  Abdominal:     General: Abdomen is flat.     Palpations: Abdomen is soft.     Tenderness: There is no abdominal tenderness. There is no guarding.  Musculoskeletal:        General: Normal range of motion.     Cervical back: Normal range of motion.  Skin:    General:  Skin is warm.     Capillary Refill: Capillary refill takes less than 2 seconds.  Neurological:     General: No focal deficit present.     Mental Status: She is alert and oriented to person, place, and time.  Psychiatric:        Mood and Affect: Mood normal.     ED Results / Procedures / Treatments   Labs (all labs ordered are listed, but only abnormal results are displayed) Labs Reviewed  RESP PANEL BY RT-PCR (RSV, FLU A&B, COVID)  [id number]    EKG None  Radiology No results found.  Procedures Procedures    Medications Ordered in ED Medications - No data to display  ED Course/ Medical Decision Making/ A&P                             Medical Decision Making  Shirrell IVOR PANDA 52 y.o. presented today for cough and URI symptoms. Working DDx that I  considered at this time includes, but not limited to, viral illness, Pneumonia.  Review of prior external notes: None  Unique Tests and My Interpretation:  Strep panel: Negative  Discussion with Independent Historian: None  Discussion of Management of Tests: None  Risk: Low:  - based on diagnostic testing/clinical impression and treatment plan    Risk Stratification Score: None  R/o DDx: PNA: no fever and duration of symptoms would favor more viral illness AOM: Patient did not endorse any ear pain and tympanic membrane's were not erythematous or bulging or perforated  Plan: Patient presented with URI-like symptoms.  Patient's respiratory panel was negative and patient's vitals are stable at this time.  Patient most likely has a viral illness that was not caught on the viral panel.  Patient's ear canals were erythematous most likely secondary to a viral infection as opposed to bacterial infection or otitis media.  Patient is time is stable for discharge and follow-up with primary care.  I spoke with the patient about supportive measures and patient will be given a work note.  Patient was given return precautions.  Patient verbalized agreement to this plan.         Final Clinical Impression(s) / ED Diagnoses Final diagnoses:  Viral illness    Rx / DC Orders ED Discharge Orders     None         Elvina Sidle 06/02/22 1153    Long, Wonda Olds, MD 06/05/22 1032

## 2022-06-03 ENCOUNTER — Ambulatory Visit: Payer: No Typology Code available for payment source | Admitting: Obstetrics & Gynecology

## 2022-06-12 ENCOUNTER — Ambulatory Visit: Payer: No Typology Code available for payment source | Admitting: Obstetrics & Gynecology

## 2022-08-08 ENCOUNTER — Ambulatory Visit (INDEPENDENT_AMBULATORY_CARE_PROVIDER_SITE_OTHER): Payer: 59 | Admitting: Obstetrics & Gynecology

## 2022-08-08 ENCOUNTER — Encounter: Payer: Self-pay | Admitting: Obstetrics & Gynecology

## 2022-08-08 ENCOUNTER — Other Ambulatory Visit (HOSPITAL_COMMUNITY)
Admission: RE | Admit: 2022-08-08 | Discharge: 2022-08-08 | Disposition: A | Discharge status: Home / Self Care | Payer: 59 | Source: Ambulatory Visit | Attending: Obstetrics & Gynecology | Admitting: Obstetrics & Gynecology

## 2022-08-08 VITALS — BP 120/76 | HR 74 | Ht 63.25 in | Wt 167.0 lb

## 2022-08-08 DIAGNOSIS — Z01419 Encounter for gynecological examination (general) (routine) without abnormal findings: Secondary | ICD-10-CM | POA: Insufficient documentation

## 2022-08-08 DIAGNOSIS — Z780 Asymptomatic menopausal state: Secondary | ICD-10-CM

## 2022-08-08 NOTE — Progress Notes (Signed)
Kimberly Vargas 01/27/1971 [id number]   History:    52 y.o.  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   RP:  Established patient presenting for annual gyn exam    HPI:  Postmenopause x2015 at age 27 confirmed with high FSH. Well tolerated without HRT.  No PMB.  No Pelvic Pain currently, occasional Rt pelvic discomfort.  Abstinent currently. Pap Neg 05/2019.  Pap reflex today. Breast normal.  Mammo Neg 02/2022. Urine normal.  Bowel movements normal.  On vitamin D supplements and exercising regularly.  BMI 29.35. Health labs with Fam MD. Colono with Dr Loreta Ave 06/2020.  Pelvic US 09/28/20: single intramural fibroid measured at 4.3 cm at the anterior left side of the uterus. The overall uterine size is measured at 5.27 x 5.27 x 5.02 cm. The endometrial lining is thin and symmetrical with no mass or thickening seen measured at 2.3 mm. Both ovaries are normal in size.    Past medical history,surgical history, family history and social history were all reviewed and documented in the EPIC chart.  Gynecologic History Patient's last menstrual period was 08/17/2013.  Obstetric History OB History  Gravida Para Term Preterm AB Living  SAB IAB Ectopic Multiple Live Births      1   2    # Outcome Date GA Lbr Len/2nd Weight Sex Delivery Anes PTL Lv  3 Ectopic           2 Preterm           1 Preterm              ROS: A ROS was performed and pertinent positives and negatives are included in the history. GENERAL: No fevers or chills. HEENT: No change in vision, no earache, sore throat or sinus congestion. NECK: No pain or stiffness. CARDIOVASCULAR: No chest pain or pressure. No palpitations. PULMONARY: No shortness of breath, cough or wheeze. GASTROINTESTINAL: No abdominal pain, nausea, vomiting or diarrhea, melena or bright red blood per rectum. GENITOURINARY: No urinary frequency, urgency, hesitancy or dysuria. MUSCULOSKELETAL: No joint or muscle pain, no  back pain, no recent trauma. DERMATOLOGIC: No rash, no itching, no lesions. ENDOCRINE: No polyuria, polydipsia, no heat or cold intolerance. No recent change in weight. HEMATOLOGICAL: No anemia or easy bruising or bleeding. NEUROLOGIC: No headache, seizures, numbness, tingling or weakness. PSYCHIATRIC: No depression, no loss of interest in normal activity or change in sleep pattern.     Exam:   BP 120/76   Pulse 74   Ht 5' 3.25" (1.607 m)   Wt 167 lb (75.8 kg)   LMP 08/17/2013 Comment: not sexually active  SpO2 99%   BMI 29.35 kg/m   Body mass index is 29.35 kg/m.  General appearance : Well developed well nourished female. No acute distress HEENT: Eyes: no retinal hemorrhage or exudates,  Neck supple, trachea midline, no carotid bruits, no thyroidmegaly Lungs: Clear to auscultation, no rhonchi or wheezes, or rib retractions  Heart: Regular rate and rhythm, no murmurs or gallops Breast:Examined in sitting and supine position were symmetrical in appearance, no palpable masses or tenderness,  no skin retraction, no nipple inversion, no nipple discharge, no skin discoloration, no axillary or supraclavicular lymphadenopathy Abdomen: no palpable masses or tenderness, no rebound or guarding Extremities: no edema or skin discoloration or tenderness  Pelvic: Vulva: Normal             Vagina: No gross lesions  or discharge  Cervix: No gross lesions or discharge.  Pap reflex done.  Uterus  RV, normal size, shape and consistency, non-tender and mobile  Adnexa  Without masses or tenderness  Anus: Normal   Assessment/Plan:  52 y.o. female for annual exam   1. Encounter for routine gynecological examination with Papanicolaou smear of cervix Postmenopause x2015 at age 70 confirmed with high FSH. Well tolerated without HRT.  No PMB.  No Pelvic Pain currently, occasional Rt pelvic discomfort.  Abstinent currently. Pap Neg 05/2019.  Pap reflex today. Breast normal.  Mammo Neg 02/2022. Urine normal.   Bowel movements normal.  On vitamin D supplements and exercising regularly.  BMI 29.35. Health labs with Fam MD. Colono with Dr Loreta Ave 06/2020. - Cytology - PAP( [facility])  2. Postmenopausal Postmenopause x2015 at age 49 confirmed with high FSH. Well tolerated without HRT.  No PMB.    Other orders - levothyroxine (SYNTHROID) 75 MCG tablet; Take 75 mcg by mouth every morning.   Genia Del MD, 4:02 PM

## 2022-08-14 LAB — CYTOLOGY - PAP: Diagnosis: NEGATIVE

## 2022-09-02 DIAGNOSIS — H52203 Unspecified astigmatism, bilateral: Secondary | ICD-10-CM | POA: Diagnosis not present

## 2022-11-26 ENCOUNTER — Other Ambulatory Visit: Payer: Self-pay

## 2022-11-26 DIAGNOSIS — Z021 Encounter for pre-employment examination: Secondary | ICD-10-CM

## 2022-11-26 NOTE — Progress Notes (Signed)
Pre employment UDS completed and cleared for [facility].

## 2023-02-12 ENCOUNTER — Ambulatory Visit: Payer: Self-pay

## 2023-02-12 DIAGNOSIS — R8290 Unspecified abnormal findings in urine: Secondary | ICD-10-CM

## 2023-02-12 DIAGNOSIS — Z0000 Encounter for general adult medical examination without abnormal findings: Secondary | ICD-10-CM

## 2023-02-12 LAB — POCT URINALYSIS DIPSTICK
Bilirubin, UA: NEGATIVE
Blood, UA: NEGATIVE
Glucose, UA: NEGATIVE
Ketones, UA: NEGATIVE
Leukocytes, UA: NEGATIVE
Nitrite, UA: NEGATIVE
Protein, UA: NEGATIVE
Spec Grav, UA: 1.025 (ref 1.010–1.025)
Urobilinogen, UA: 0.2 U/dL
pH, UA: 6 (ref 5.0–8.0)

## 2023-02-12 NOTE — Progress Notes (Signed)
Fasting labs, urine and EKG obtained pre physical for [facility].

## 2023-02-12 NOTE — Addendum Note (Signed)
Addended by: Hansel Feinstein on: 02/12/2023 09:30 AM   Modules accepted: Orders

## 2023-02-13 LAB — CMP12+LP+TP+TSH+6AC+CBC/D/PLT
ALT: 15 [IU]/L (ref 0–32)
AST: 21 [IU]/L (ref 0–40)
Albumin: 4.4 g/dL (ref 3.8–4.9)
Alkaline Phosphatase: 74 [IU]/L (ref 44–121)
BUN/Creatinine Ratio: 6 — ABNORMAL LOW (ref 9–23)
BUN: 6 mg/dL (ref 6–24)
Basophils Absolute: 0 10*3/uL (ref 0.0–0.2)
Basos: 0 %
Bilirubin Total: 0.7 mg/dL (ref 0.0–1.2)
Calcium: 9.6 mg/dL (ref 8.7–10.2)
Chloride: 106 mmol/L (ref 96–106)
Chol/HDL Ratio: 4.8 ratio — ABNORMAL HIGH (ref 0.0–4.4)
Cholesterol, Total: 252 mg/dL — ABNORMAL HIGH (ref 100–199)
Creatinine, Ser: 1.04 mg/dL — ABNORMAL HIGH (ref 0.57–1.00)
EOS (ABSOLUTE): 0.1 10*3/uL (ref 0.0–0.4)
Eos: 3 %
Estimated CHD Risk: 1.2 times avg. — ABNORMAL HIGH (ref 0.0–1.0)
Free Thyroxine Index: 2.5 (ref 1.2–4.9)
GGT: 16 [IU]/L (ref 0–60)
Globulin, Total: 2.4 g/dL (ref 1.5–4.5)
Glucose: 95 mg/dL (ref 70–99)
HDL: 53 mg/dL (ref >39)
Hematocrit: 42.2 % (ref 34.0–46.6)
Hemoglobin: 13.5 g/dL (ref 11.1–15.9)
Immature Grans (Abs): 0 10*3/uL (ref 0.0–0.1)
Immature Granulocytes: 0 %
Iron: 93 ug/dL (ref 27–159)
LDH: 187 [IU]/L (ref 119–226)
LDL Chol Calc (NIH): 179 mg/dL — ABNORMAL HIGH (ref 0–99)
Lymphocytes Absolute: 0.7 10*3/uL (ref 0.7–3.1)
Lymphs: 26 %
MCH: 30.4 pg (ref 26.6–33.0)
MCHC: 32 g/dL (ref 31.5–35.7)
MCV: 95 fL (ref 79–97)
Monocytes Absolute: 0.3 10*3/uL (ref 0.1–0.9)
Monocytes: 9 %
Neutrophils Absolute: 1.7 10*3/uL (ref 1.4–7.0)
Neutrophils: 62 %
Phosphorus: 3.5 mg/dL (ref 3.0–4.3)
Platelets: 305 10*3/uL (ref 150–450)
Potassium: 4.4 mmol/L (ref 3.5–5.2)
RBC: 4.44 x10E6/uL (ref 3.77–5.28)
RDW: 11.8 % (ref 11.7–15.4)
Sodium: 144 mmol/L (ref 134–144)
T3 Uptake Ratio: 24 % (ref 24–39)
T4, Total: 10.4 ug/dL (ref 4.5–12.0)
TSH: 0.071 u[IU]/mL — ABNORMAL LOW (ref 0.450–4.500)
Total Protein: 6.8 g/dL (ref 6.0–8.5)
Triglycerides: 111 mg/dL (ref 0–149)
Uric Acid: 4.4 mg/dL (ref 3.0–7.2)
VLDL Cholesterol Cal: 20 mg/dL (ref 5–40)
WBC: 2.8 10*3/uL — ABNORMAL LOW (ref 3.4–10.8)
eGFR: 65 mL/min/{1.73_m2} (ref >59)

## 2023-02-14 LAB — URINE CULTURE

## 2023-02-17 ENCOUNTER — Ambulatory Visit: Payer: Self-pay | Admitting: Physician Assistant

## 2023-02-17 ENCOUNTER — Encounter: Payer: Self-pay | Admitting: Physician Assistant

## 2023-02-17 VITALS — BP 119/75 | HR 69 | Temp 97.1°F | Resp 16 | Ht 64.0 in | Wt 164.0 lb

## 2023-02-17 DIAGNOSIS — Z0000 Encounter for general adult medical examination without abnormal findings: Secondary | ICD-10-CM

## 2023-02-17 MED ORDER — ROSUVASTATIN CALCIUM 40 MG PO TABS: 40.0000 mg | ORAL_TABLET | Freq: Every day | ORAL | 3 refills | Status: DC

## 2023-02-17 NOTE — Progress Notes (Signed)
Here for first physical with [facility], but stated she's in the process of switching a PCP and hasn't found one yet.  She voiced noted her lipid panel which she never noted to be out of range before.  Stated recently loss of father, son in college and new job which have her very stressed and sometimes trouble sleeping.

## 2023-02-17 NOTE — Progress Notes (Signed)
[facility] e  ____________________________________________   None    (approximate)  I have reviewed the triage vital signs and the nursing notes.   HISTORY  Chief Complaint Annual Exam   HPI Kimberly Vargas is a 52 y.o. female patient presents for annual physical exam.  Patient relates she is under a lot of stress secondary to lifestyle changes.  Patient also states she is having trouble sleeping.  Patient is also trying to find a new PCP.         Past Medical History:  Diagnosis Date   Anemia    Ectopic pregnancy 2001   treated with methotrexate   NSVD (normal spontaneous vaginal delivery)    PROM (premature rupture of membranes)    DELIVERY AT 34 WKS    Patient Active Problem List   Diagnosis Date Noted   Premature ovarian failure 03/20/2016   Low TSH level 10/28/2014   Hot flushes, perimenopausal 08/25/2014   Squeezing chest pain 04/23/2012    Class: Acute   Varicose veins of bilateral lower extremities with other complications 02/25/2012   Swelling of limb 11/25/2011   Leg pain 11/25/2011    Past Surgical History:  Procedure Laterality Date   DILATION AND CURETTAGE OF UTERUS     RESECTOSCOPIC POLYPECTOMY  12/25/2005   SHOULDER SURGERY Right 02/2019    Prior to Admission medications   Medication Sig Start Date End Date Taking? Authorizing Provider  levothyroxine (SYNTHROID) 75 MCG tablet Take 75 mcg by mouth every morning. 07/14/22  Yes [provider]    Allergies Patient has no known allergies.  Family History  Problem Relation Age of Onset   Cancer Maternal Grandmother        OVARIAN   Hypertension Father    Heart disease Father        before age 40   Diabetes Father    Hyperlipidemia Father    Heart attack Father    Hypertension Sister    Diabetes Paternal Grandmother    Other Mother        varicose veins    Social History Social History   Tobacco Use   Smoking status: Never   Smokeless  tobacco: Never  Vaping Use   Vaping status: Never Used  Substance Use Topics   Alcohol use: No    Alcohol/week: 0.0 standard drinks of alcohol   Drug use: No    Review of Systems Constitutional: No fever/chills Eyes: No visual changes. ENT: No sore throat. Cardiovascular: Denies chest pain. Respiratory: Denies shortness of breath. Gastrointestinal: No abdominal pain.  No nausea, no vomiting.  No diarrhea.  No constipation. Genitourinary: Negative for dysuria. Musculoskeletal: Negative for back pain. Skin: Negative for rash. Neurological: Negative for headaches, focal weakness or numbness. Endocrine: Hypothyroidism ____________________________________________   PHYSICAL EXAM:  VITAL SIGNS: BP 119/75  Pulse 69  Resp 16  Temp 97.1 F (36.2 C)  SpO2 100 %  Weight 164 lb (74.4 kg)  Height 5\' 4"  (1.626 m)   BMI 28.15 kg/m2  BSA 1.83 m2   Constitutional: Alert and oriented. Well appearing and in no acute distress. Eyes: Conjunctivae are normal. PERRL. EOMI. Head: Atraumatic. Nose: No congestion/rhinnorhea. Mouth/Throat: Mucous membranes are moist.  Oropharynx non-erythematous. Neck: No stridor.  No cervical spine tenderness to palpation. Hematological/Lymphatic/Immunilogical: No cervical lymphadenopathy. Cardiovascular: Normal rate, regular rhythm. Grossly normal heart sounds.  Good peripheral circulation. Respiratory: Normal respiratory effort.  No retractions. Lungs CTAB. Gastrointestinal: Soft and nontender. No distention. No  abdominal bruits. No CVA tenderness. Genitourinary: Deferred Musculoskeletal: No lower extremity tenderness nor edema.  No joint effusions. Neurologic:  Normal speech and language. No gross focal neurologic deficits are appreciated. No gait instability. Skin:  Skin is warm, dry and intact. No rash noted. Psychiatric: Mood and affect are normal. Speech and behavior are normal.  ____________________________________________   LABS            Component Ref Range & Units 5 d ago (02/12/23) 6 yr ago (03/20/16) 8 yr ago (08/25/14) 9 yr ago (10/17/13) 9 yr ago (05/11/13) 10 yr ago (04/20/12) 10 yr ago (04/09/12)  Color, UA yellow        Clarity, UA hazy        Glucose, UA Negative Negative        Bilirubin, UA neg        Ketones, UA neg        Spec Grav, UA 1.010 - 1.025 1.025        Blood, UA neg        pH, UA 5.0 - 8.0 6.0        Protein, UA Negative Negative        Urobilinogen, UA 0.2 or 1.0 E.U./dL 0.2  0.2 R 0.2 R 0.2 R 0.2 R 1 R  Nitrite, UA neg        Leukocytes, UA Negative Negative 2+ Abnormal  R NEG R NEGATIVE R, CM NEG R NEGATIVE R, CM NEG R  Appearance dark CLEAR R CLEAR R CLEAR R CLEAR R CLEAR R CLEAR R  Odor none        Resulting Agency  [facility]         Specimen Collected: 02/12/23 09:24 Last Resulted: 02/12/23 09:24      Lab Flowsheet      Order Details      View Encounter      Lab and Collection Details      Routing      Result History    View All Conversations on this Encounter      CM=Additional comments  R=Reference range differs from displayed range      Result Care Coordination   Patient Communication   Add Comments   Seen Back to Top        Contains abnormal data Executive Panel Order: [id number] Status: Final result     Visible to patient: Yes (seen)     Next appt: 05/26/2023 at 08:30 AM in [facility] ([facility] NURSE)     Dx: Annual physical exam   0 Result Notes          Component Ref Range & Units 5 d ago (02/12/23) 6 yr ago (03/20/16) 6 yr ago (03/20/16) 6 yr ago (03/20/16) 6 yr ago (03/20/16) 8 yr ago (12/09/14) 8 yr ago (08/30/14)  Glucose 70 - 99 mg/dL 95   82 R     Uric Acid 3.0 - 7.2 mg/dL 4.4        Comment:            Therapeutic target for gout patients: <6.0  BUN 6 - 24 mg/dL 6   12 R     Creatinine, Ser 0.57 - 1.00 mg/dL 8.29 High    5.62 R     eGFR >59 mL/min/1.73 65        BUN/Creatinine  Ratio 9 - 23 6 Low         Sodium 134 -  144 mmol/L 144   138 R     Potassium 3.5 - 5.2 mmol/L 4.4   4.1 R     Chloride 96 - 106 mmol/L 106   101 R     Calcium 8.7 - 10.2 mg/dL 9.6   9.8 R     Phosphorus 3.0 - 4.3 mg/dL 3.5        Total Protein 6.0 - 8.5 g/dL 6.8   7.3 R     Albumin 3.8 - 4.9 g/dL 4.4   4.5 R     Globulin, Total 1.5 - 4.5 g/dL 2.4        Bilirubin Total 0.0 - 1.2 mg/dL 0.7   1.2 R     Alkaline Phosphatase 44 - 121 IU/L 74   62 R     LDH 119 - 226 IU/L 187        AST 0 - 40 IU/L 21   23 R     ALT 0 - 32 IU/L 15   18 R     GGT 0 - 60 IU/L 16        Iron 27 - 159 ug/dL 93        Cholesterol, Total 100 - 199 mg/dL 562 High         Triglycerides 0 - 149 mg/dL 130  865 R, CM      HDL >39 mg/dL 53  60 R, CM      VLDL Cholesterol Cal 5 - 40 mg/dL 20        LDL Chol Calc (NIH) 0 - 99 mg/dL 784 High         Chol/HDL Ratio 0.0 - 4.4 ratio 4.8 High   4.5 R      Comment:                                   T. Chol/HDL Ratio                                             Men  Women                               1/2 Avg.Risk  3.4    3.3                                   Avg.Risk  5.0    4.4                                2X Avg.Risk  9.6    7.1                                3X Avg.Risk 23.4   11.0  Estimated CHD Risk 0.0 - 1.0 times avg. 1.2 High         Comment: The CHD Risk is based on the T. Chol/HDL ratio. Other factors affect CHD Risk such as hypertension, smoking, diabetes, severe obesity, and family history of premature CHD.  TSH 0.450 - 4.500 uIU/mL 0.071 Low  1.70 R, CM  1.03 R 0.036 Low  R  T4, Total 4.5 - 12.0 ug/dL 41.3      8.0  T3 Uptake Ratio 24 - 39 % 24        Free Thyroxine Index 1.2 - 4.9 2.5      2.2 R  WBC 3.4 - 10.8 x10E3/uL 2.8 Low     3.3 Low  R    RBC 3.77 - 5.28 x10E6/uL 4.44    4.43 R    Hemoglobin 11.1 - 15.9 g/dL 24.4    01.0 R    Hematocrit 34.0 - 46.6 % 42.2    40.9 R    MCV 79 - 97 fL 95    92.3 R    MCH 26.6 -  33.0 pg 30.4    30.7 R    MCHC 31.5 - 35.7 g/dL 27.2    53.6 R    RDW 64.4 - 15.4 % 11.8    12.7 R    Platelets 150 - 450 x10E3/uL 305    277 R    Neutrophils Not Estab. % 62    52 R    Lymphs Not Estab. % 26        Monocytes Not Estab. % 9        Eos Not Estab. % 3        Basos Not Estab. % 0        Neutrophils Absolute 1.4 - 7.0 x10E3/uL 1.7    1,716 R    Lymphocytes Absolute 0.7 - 3.1 x10E3/uL 0.7    1,221 R    Monocytes Absolute 0.1 - 0.9 x10E3/uL 0.3        EOS (ABSOLUTE) 0.0 - 0.4 x10E3/uL 0.1        Basophils Absolute 0.0 - 0.2 x10E3/uL 0.0    0 R    Immature Granulocytes Not Estab. % 0        Immature Grans (Abs)           ____________________________________________  EKG  Sinus bradycardia 57 bpm ____________________________________________    ____________________________________________   INITIAL IMPRESSION / ASSESSMENT AND PLAN  As part of my medical decision making, I reviewed the following data within the electronic MEDICAL RECORD NUMBER      No acute findings on physical exam or EKG.  Labs shows elevated lipids.  Patient amenable to starting a statin and follow-up in 3 months.  Advised over-the-counter melatonin for sleep aid.        ____________________________________________   FINAL CLINICAL IMPRESSION  Well exam   ED Discharge Orders     None        Note:  This document was prepared using Dragon voice recognition software and may include unintentional dictation errors.

## 2023-05-26 ENCOUNTER — Other Ambulatory Visit: Payer: Self-pay

## 2023-05-26 DIAGNOSIS — E785 Hyperlipidemia, unspecified: Secondary | ICD-10-CM

## 2023-05-26 NOTE — Progress Notes (Signed)
Pt presents today to follow up on 3 month lipid panel.

## 2023-05-27 LAB — LIPID PANEL
Chol/HDL Ratio: 2.7 {ratio} (ref 0.0–4.4)
Cholesterol, Total: 125 mg/dL (ref 100–199)
HDL: 46 mg/dL (ref >39)
LDL Chol Calc (NIH): 62 mg/dL (ref 0–99)
Triglycerides: 90 mg/dL (ref 0–149)
VLDL Cholesterol Cal: 17 mg/dL (ref 5–40)

## 2023-10-15 ENCOUNTER — Other Ambulatory Visit: Payer: Self-pay | Admitting: Internal Medicine

## 2023-10-15 DIAGNOSIS — J309 Allergic rhinitis, unspecified: Secondary | ICD-10-CM | POA: Diagnosis not present

## 2023-10-15 DIAGNOSIS — Z0000 Encounter for general adult medical examination without abnormal findings: Secondary | ICD-10-CM | POA: Diagnosis not present

## 2023-10-15 DIAGNOSIS — Z1389 Encounter for screening for other disorder: Secondary | ICD-10-CM | POA: Diagnosis not present

## 2023-10-15 DIAGNOSIS — E039 Hypothyroidism, unspecified: Secondary | ICD-10-CM | POA: Diagnosis not present

## 2023-10-15 DIAGNOSIS — Z23 Encounter for immunization: Secondary | ICD-10-CM | POA: Diagnosis not present

## 2023-10-15 DIAGNOSIS — Z1231 Encounter for screening mammogram for malignant neoplasm of breast: Secondary | ICD-10-CM

## 2023-10-15 DIAGNOSIS — E7800 Pure hypercholesterolemia, unspecified: Secondary | ICD-10-CM | POA: Diagnosis not present

## 2023-10-20 ENCOUNTER — Other Ambulatory Visit: Payer: Self-pay | Admitting: Internal Medicine

## 2023-10-20 DIAGNOSIS — Z1231 Encounter for screening mammogram for malignant neoplasm of breast: Secondary | ICD-10-CM

## 2023-10-27 ENCOUNTER — Ambulatory Visit
Admission: RE | Admit: 2023-10-27 | Discharge: 2023-10-27 | Disposition: A | Discharge status: Home / Self Care | Source: Ambulatory Visit

## 2023-10-27 DIAGNOSIS — Z1231 Encounter for screening mammogram for malignant neoplasm of breast: Secondary | ICD-10-CM

## 2023-11-13 NOTE — Progress Notes (Signed)
 53 y.o. [id number] female here for annual exam. Single.  Patient's last menstrual period was 08/17/2013.    She reports ***.  Urine sample provided: ***  Abnormal bleeding: *** Pelvic discharge or pain: *** Breast mass, nipple discharge or skin changes : ***  Sexually active: *** Birth control: *** Last PAP:     Component Value Date/Time   DIAGPAP  08/08/2022 1620    - Negative for intraepithelial lesion or malignancy (NILM)   ADEQPAP  08/08/2022 1620    Satisfactory for evaluation; transformation zone component PRESENT.   Last mammogram: 10/27/23 Bi-Rads 1 Last colonoscopy: 07/19/2020 10 year recall  Exercising: *** Smoker: ***       GYN HISTORY: ***  OB History  Gravida Para Term Preterm AB Living  3 2  2 1 2   SAB IAB Ectopic Multiple Live Births    1  2    # Outcome Date GA Lbr Len/2nd Weight Sex Type Anes PTL Lv  3 Ectopic           2 Preterm           1 Preterm            Past Medical History:  Diagnosis Date   Anemia    Ectopic pregnancy 2001   treated with methotrexate   NSVD (normal spontaneous vaginal delivery)    PROM (premature rupture of membranes)    DELIVERY AT 34 WKS   Past Surgical History:  Procedure Laterality Date   DILATION AND CURETTAGE OF UTERUS     RESECTOSCOPIC POLYPECTOMY  12/25/2005   SHOULDER SURGERY Right 02/2019   Current Outpatient Medications on File Prior to Visit  Medication Sig Dispense Refill   levothyroxine (SYNTHROID) 75 MCG tablet Take 75 mcg by mouth every morning.     rosuvastatin  (CRESTOR ) 40 MG tablet Take 1 tablet (40 mg total) by mouth daily. 90 tablet 3   No current facility-administered medications on file prior to visit.   Social History   Socioeconomic History   Marital status: Single    Spouse name: Not on file   Number of children: Not on file   Years of education: Not on file   Highest education level: Not on file  Occupational History   Not on file  Tobacco Use   Smoking status: Never    Smokeless tobacco: Never  Vaping Use   Vaping status: Never Used  Substance and Sexual Activity   Alcohol use: No    Alcohol/week: 0.0 standard drinks of alcohol   Drug use: No   Sexual activity: Not Currently    Birth control/protection: Abstinence    Comment: intercourse age 54, sexual partners less than 5  Other Topics Concern   Not on file  Social History Narrative   Not on file   Social Drivers of Health   Financial Resource Strain: Not on file  Food Insecurity: Not on file  Transportation Needs: Not on file  Physical Activity: Not on file  Stress: Not on file  Social Connections: Not on file  Intimate Partner Violence: Not on file   Family History  Problem Relation Age of Onset   Other Mother        varicose veins   Hypertension Father    Heart disease Father        before age 11   Diabetes Father    Hyperlipidemia Father    Heart attack Father    Hypertension Sister    Ovarian cancer Maternal Grandmother  50   Diabetes Paternal Grandmother    Breast cancer Neg Hx    BRCA 1/2 Neg Hx    No Known Allergies   PE There were no vitals filed for this visit. There is no height or weight on file to calculate BMI.  Physical Exam    Assessment and Plan:        There are no diagnoses linked to this encounter. Clotilda FORBES Pa, CMA

## 2023-11-14 ENCOUNTER — Ambulatory Visit (INDEPENDENT_AMBULATORY_CARE_PROVIDER_SITE_OTHER): Payer: Self-pay | Admitting: Obstetrics and Gynecology

## 2023-11-14 ENCOUNTER — Encounter: Payer: Self-pay | Admitting: Obstetrics and Gynecology

## 2023-11-14 VITALS — BP 118/78 | HR 62 | Temp 98.1°F | Ht 65.0 in | Wt 167.0 lb

## 2023-11-14 DIAGNOSIS — Z01419 Encounter for gynecological examination (general) (routine) without abnormal findings: Secondary | ICD-10-CM | POA: Diagnosis not present

## 2023-11-14 DIAGNOSIS — Z1331 Encounter for screening for depression: Secondary | ICD-10-CM

## 2023-11-14 NOTE — Assessment & Plan Note (Signed)
 Cervical cancer screening performed according to ASCCP guidelines. Encouraged annual mammogram screening Colonoscopy UTD DXA, plan for 53yo Labs and immunizations with her primary Encouraged safe sexual practices as indicated Encouraged healthy lifestyle practices with diet and exercise For patients under 50-70yo, I recommend 1200mg  calcium  daily and 600IU of vitamin D  daily.

## 2023-11-14 NOTE — Patient Instructions (Signed)

## 2024-01-08 ENCOUNTER — Encounter: Payer: Self-pay | Admitting: Gastroenterology

## 2024-03-01 ENCOUNTER — Encounter: Payer: Self-pay | Admitting: Gastroenterology

## 2024-03-01 ENCOUNTER — Ambulatory Visit: Admitting: Gastroenterology

## 2024-03-01 VITALS — BP 110/64 | HR 70 | Ht 64.0 in | Wt 170.0 lb

## 2024-03-01 DIAGNOSIS — K219 Gastro-esophageal reflux disease without esophagitis: Secondary | ICD-10-CM | POA: Diagnosis not present

## 2024-03-01 DIAGNOSIS — R1013 Epigastric pain: Secondary | ICD-10-CM | POA: Diagnosis not present

## 2024-03-01 DIAGNOSIS — R110 Nausea: Secondary | ICD-10-CM

## 2024-03-01 MED ORDER — PANTOPRAZOLE SODIUM 40 MG PO TBEC: 40.0000 mg | DELAYED_RELEASE_TABLET | Freq: Every day | ORAL | 3 refills | Status: AC

## 2024-03-01 NOTE — Progress Notes (Addendum)
 Chief Complaint: Epigastric pain Primary GI MD: Sampson  HPI: Discussed the use of AI scribe software for clinical note transcription with the patient, who gave verbal consent to proceed.  Kimberly Vargas is a 53 year old female with a history of hiatal hernia who presents with recurrent upper abdominal pain and bloating.  Upper abdominal pain and bloating - Recurrent upper abdominal pain and bloating for the past 3-4 months, particularly after eating - Sensation of food being 'stuck' in the upper abdomen - Associated nausea - No rectal bleeding, change in bowel habits, or weight loss - Weight gain present  Hiatal hernia - History of hiatal hernia diagnosed by endoscopy in 2012 after similar symptoms - No recent use of antacid medications - No prior trial of pantoprazole  Gastrointestinal workup - Colonoscopy in March 2022 unremarkable - Abdominal ultrasound in April 2022 unremarkable - CT abdomen/pelvis with contrast in May 2022 unremarkable - HIDA scan showed normal gallbladder function with ejection fraction of 85%  Medication and supplement use - Extensive use of ibuprofen  and BC powders during college for migraines, discontinued - Previous use of probiotics, discontinued due to adverse effects  Psychosocial stressors - Significant stress over the past year due to unexpected passing of her father, mother's dementia diagnosis, son taking a gap year from college, and starting a new job  PREVIOUS GI WORKUP   Colonoscopy 06/2020 with Dr. Kristie - Entire examined colon normal send and treatment 10 years  EGD 2012 for GERD/nausea/vomiting -- normal esophagus and GEJ - nodular gastric mucosa - small hiatal hernia - normal proximal small bowel  Past Medical History:  Diagnosis Date   Anemia    Ectopic pregnancy 04/23/1999   treated with methotrexate   Hyperlipidemia    Hypothyroidism    NSVD (normal spontaneous vaginal delivery)    PROM (premature rupture of  membranes)    DELIVERY AT 34 WKS    Past Surgical History:  Procedure Laterality Date   COLONOSCOPY     Dr Kristie   DILATION AND CURETTAGE OF UTERUS     ESOPHAGOGASTRODUODENOSCOPY     Dr Kristie   RESECTOSCOPIC POLYPECTOMY  12/25/2005   SHOULDER SURGERY Right 02/2019    Current Outpatient Medications  Medication Sig Dispense Refill   levothyroxine (SYNTHROID) 75 MCG tablet Take 75 mcg by mouth every morning.     pantoprazole (PROTONIX) 40 MG tablet Take 1 tablet (40 mg total) by mouth daily. 30 tablet 3   rosuvastatin  (CRESTOR ) 10 MG tablet Take 10 mg by mouth daily.     No current facility-administered medications for this visit.    Allergies as of 03/01/2024   (No Known Allergies)    Family History  Problem Relation Age of Onset   Other Mother        varicose veins   Hypertension Father    Heart disease Father        before age 25   Diabetes Father    Hyperlipidemia Father    Heart attack Father    Hypertension Sister    Ovarian cancer Maternal Grandmother 59   Diabetes Paternal Grandmother    Breast cancer Neg Hx    BRCA 1/2 Neg Hx    Colon cancer Neg Hx    Esophageal cancer Neg Hx     Social History   Socioeconomic History   Marital status: Single    Spouse name: Not on file   Number of children: 2   Years of education: Not on file  Highest education level: Not on file  Occupational History   Not on file  Tobacco Use   Smoking status: Never   Smokeless tobacco: Never  Vaping Use   Vaping status: Never Used  Substance and Sexual Activity   Alcohol use: No    Alcohol/week: 0.0 standard drinks of alcohol   Drug use: No   Sexual activity: Not Currently    Birth control/protection: Abstinence    Comment: intercourse age 4, sexual partners less than 5  Other Topics Concern   Not on file  Social History Narrative   Not on file   Social Drivers of Health   Financial Resource Strain: Not on file  Food Insecurity: Not on file  Transportation Needs:  Not on file  Physical Activity: Not on file  Stress: Not on file  Social Connections: Not on file  Intimate Partner Violence: Not on file    Review of Systems:    Constitutional: No weight loss, fever, chills, weakness or fatigue HEENT: Eyes: No change in vision               Ears, Nose, Throat:  No change in hearing or congestion Skin: No rash or itching Cardiovascular: No chest pain, chest pressure or palpitations   Respiratory: No SOB or cough Gastrointestinal: See HPI and otherwise negative Genitourinary: No dysuria or change in urinary frequency Neurological: No headache, dizziness or syncope Musculoskeletal: No new muscle or joint pain Hematologic: No bleeding or bruising Psychiatric: No history of depression or anxiety    Physical Exam:  Vital signs: BP 110/64   Pulse 70   Ht 5' 4 (1.626 m)   Wt 170 lb (77.1 kg)   LMP 08/17/2013 Comment: not sexually active  BMI 29.18 kg/m   Constitutional: NAD, alert and cooperative Head:  Normocephalic and atraumatic. Eyes:   PEERL, EOMI. No icterus. Conjunctiva pink. Respiratory: Respirations even and unlabored. Lungs clear to auscultation bilaterally.   No wheezes, crackles, or rhonchi.  Cardiovascular:  Regular rate and rhythm. No peripheral edema, cyanosis or pallor.  Gastrointestinal:  Soft, nondistended, nontender. No rebound or guarding. Normal bowel sounds. No appreciable masses or hepatomegaly. Rectal:  Declines Msk:  Symmetrical without gross deformities. Without edema, no deformity or joint abnormality.  Neurologic:  Alert and  oriented x4;  grossly normal neurologically.  Skin:   Dry and intact without significant lesions or rashes. Psychiatric: Oriented to person, place and time. Demonstrates good judgement and reason without abnormal affect or behaviors.  Physical Exam    RELEVANT LABS AND IMAGING: CBC    Component Value Date/Time   WBC 2.8 (L) 02/12/2023 0905   WBC 3.3 (L) 03/20/2016 1535   RBC 4.44  02/12/2023 0905   RBC 4.43 03/20/2016 1535   HGB 13.5 02/12/2023 0905   HCT 42.2 02/12/2023 0905   PLT 305 02/12/2023 0905   MCV 95 02/12/2023 0905   MCH 30.4 02/12/2023 0905   MCH 30.7 03/20/2016 1535   MCHC 32.0 02/12/2023 0905   MCHC 33.3 03/20/2016 1535   RDW 11.8 02/12/2023 0905   LYMPHSABS 0.7 02/12/2023 0905   MONOABS 297 03/20/2016 1535   EOSABS 0.1 02/12/2023 0905   BASOSABS 0.0 02/12/2023 0905    CMP     Component Value Date/Time   NA 144 02/12/2023 0905   K 4.4 02/12/2023 0905   CL 106 02/12/2023 0905   CO2 28 03/20/2016 1535   GLUCOSE 95 02/12/2023 0905   GLUCOSE 82 03/20/2016 1535   BUN 6 02/12/2023  9094   CREATININE 1.04 (H) 02/12/2023 0905   CREATININE 0.95 03/20/2016 1535   CALCIUM  9.6 02/12/2023 0905   PROT 6.8 02/12/2023 0905   ALBUMIN 4.4 02/12/2023 0905   AST 21 02/12/2023 0905   ALT 15 02/12/2023 0905   ALKPHOS 74 02/12/2023 0905   BILITOT 0.7 02/12/2023 0905   GFRNONAA 76 (L) 10/17/2013 0842   GFRAA 88 (L) 10/17/2013 0842     Assessment/Plan:   Epigastric pain Nausea GERD Epigastric pain nausea and GERD with eating ongoing for the last 3 to 4 months.  EGD in 2012 with Dr. Kristie overall unrevealing other than small hiatal hernia.  Patient has been under significant amount of stress over the last year with unexpected death of her father, her mother has dementia and moving in with her, and son taking his grandfathers death hard and deferring college for a year.  Patient is concerned and would like EGD.  Normal labs through PCP. Workup in 2022 with negative RUQ ultrasound, normal HIDA scan, CTAP with contrast showing nodular thickening of distal duodenum and proximal jejunum with CT enterography showing no wall thickening.  Suspect current flareup of her symptoms could be functional dyspepsia secondary to extrinsic stressors but she does have an extensive history of Goody powder use in college every day for 4 years.  DDx includes esophagitis, gastritis,  PUD, H. pylori - Schedule EGD - Discussed risks versus benefits - Trial of pantoprazole 40 mg once daily - Educated patient on lifestyle modifications provided patient education handout   Colon cancer screening Colonoscopy with Dr. Kristie 2022 which was normal and repeat of 10 years - Repeat 2032   Nestor Blower, PA-C [facility] 03/01/2024, 10:40 AM  Cc: Husain, Karrar, MD

## 2024-03-01 NOTE — Patient Instructions (Signed)
 We have sent the following medications to your pharmacy for you to pick up at your convenience: Pantoprazole 40mg  once daily  You have been scheduled for an endoscopy. Please follow written instructions given to you at your visit today.  If you use inhalers (even only as needed), please bring them with you on the day of your procedure.  If you take any of the following medications, they will need to be adjusted prior to your procedure:   DO NOT TAKE 7 DAYS PRIOR TO TEST- Trulicity (dulaglutide) Ozempic, Wegovy (semaglutide) Mounjaro, Zepbound (tirzepatide) Bydureon Bcise (exanatide extended release)  DO NOT TAKE 1 DAY PRIOR TO YOUR TEST Rybelsus (semaglutide) Adlyxin (lixisenatide) Victoza (liraglutide) Byetta (exanatide) ___________________________________________________________________________  Due to recent changes in healthcare laws, you may see the results of your imaging and laboratory studies on MyChart before your provider has had a chance to review them.  We understand that in some cases there may be results that are confusing or concerning to you. Not all laboratory results come back in the same time frame and the provider may be waiting for multiple results in order to interpret others.  Please give us  48 hours in order for your provider to thoroughly review all the results before contacting the office for clarification of your results.   _______________________________________________________  If your blood pressure at your visit was 140/90 or greater, please contact your primary care physician to follow up on this.  _______________________________________________________  If you are age 75 or older, your body mass index should be between 23-30. Your Body mass index is 29.18 kg/m. If this is out of the aforementioned range listed, please consider follow up with your Primary Care Provider.  If you are age 65 or younger, your body mass index should be between 19-25. Your  Body mass index is 29.18 kg/m. If this is out of the aformentioned range listed, please consider follow up with your Primary Care Provider.   ________________________________________________________  The [facility] providers would like to encourage you to use MYCHART to communicate with providers for non-urgent requests or questions.  Due to long hold times on the telephone, sending your provider a message by [facility] may be a faster and more efficient way to get a response.  Please allow 48 business hours for a response.  Please remember that this is for non-urgent requests.  _______________________________________________________  [facility] is using a team-based approach to care.  Your team is made up of your doctor and two to three APPS. Our APPS (Nurse Practitioners and Physician Assistants) work with your physician to ensure care continuity for you. They are fully qualified to address your health concerns and develop a treatment plan. They communicate directly with your gastroenterologist to care for you. Seeing the Advanced Practice Practitioners on your physician's team can help you by facilitating care more promptly, often allowing for earlier appointments, access to diagnostic testing, procedures, and other specialty referrals.

## 2024-03-11 ENCOUNTER — Encounter: Payer: Self-pay | Admitting: Internal Medicine

## 2024-03-22 ENCOUNTER — Encounter: Payer: Self-pay | Admitting: Internal Medicine

## 2024-03-22 ENCOUNTER — Ambulatory Visit: Admitting: Internal Medicine

## 2024-03-22 VITALS — BP 123/74 | HR 69 | Temp 97.4°F | Resp 17 | Ht 64.0 in | Wt 170.0 lb

## 2024-03-22 DIAGNOSIS — K219 Gastro-esophageal reflux disease without esophagitis: Secondary | ICD-10-CM

## 2024-03-22 DIAGNOSIS — R1013 Epigastric pain: Secondary | ICD-10-CM | POA: Diagnosis not present

## 2024-03-22 DIAGNOSIS — K2950 Unspecified chronic gastritis without bleeding: Secondary | ICD-10-CM

## 2024-03-22 DIAGNOSIS — R110 Nausea: Secondary | ICD-10-CM

## 2024-03-22 MED ORDER — SODIUM CHLORIDE 0.9 % IV SOLN: 500.0000 mL | Freq: Once | INTRAVENOUS | Status: DC

## 2024-03-22 NOTE — Progress Notes (Signed)
 Pt's states no medical or surgical changes since previsit or office visit.

## 2024-03-22 NOTE — Patient Instructions (Signed)
 Discharge instructions given. Handout on Gastritis. Resume previous medications. See Recommendations on report. Patient will do lab work latter per Dr. Lovenia. YOU HAD AN ENDOSCOPIC PROCEDURE TODAY AT THE [facility]:   Refer to the procedure report that was given to you for any specific questions about what was found during the examination.  If the procedure report does not answer your questions, please call your gastroenterologist to clarify.  If you requested that your care partner not be given the details of your procedure findings, then the procedure report has been included in a sealed envelope for you to review at your convenience later.  YOU SHOULD EXPECT: Some feelings of bloating in the abdomen. Passage of more gas than usual.  Walking can help get rid of the air that was put into your GI tract during the procedure and reduce the bloating. If you had a lower endoscopy (such as a colonoscopy or flexible sigmoidoscopy) you may notice spotting of blood in your stool or on the toilet paper. If you underwent a bowel prep for your procedure, you may not have a normal bowel movement for a few days.  Please Note:  You might notice some irritation and congestion in your nose or some drainage.  This is from the oxygen used during your procedure.  There is no need for concern and it should clear up in a day or so.  SYMPTOMS TO REPORT IMMEDIATELY:   Following upper endoscopy (EGD)  Vomiting of blood or coffee ground material  New chest pain or pain under the shoulder blades  Painful or persistently difficult swallowing  New shortness of breath  Fever of 100F or higher  Black, tarry-looking stools  For urgent or emergent issues, a gastroenterologist can be reached at any hour by calling [phone]. Do not use MyChart messaging for urgent concerns.    DIET:  We do recommend a small meal at first, but then you may proceed to your regular diet.  Drink plenty of fluids but you  should avoid alcoholic beverages for 24 hours.  ACTIVITY:  You should plan to take it easy for the rest of today and you should NOT DRIVE or use heavy machinery until tomorrow (because of the sedation medicines used during the test).    FOLLOW UP: Our staff will call the number listed on your records the next business day following your procedure.  We will call around 7:15- 8:00 am to check on you and address any questions or concerns that you may have regarding the information given to you following your procedure. If we do not reach you, we will leave a message.     If any biopsies were taken you will be contacted by phone or by letter within the next 1-3 weeks.  Please call us  at [phone] if you have not heard about the biopsies in 3 weeks.    SIGNATURES/CONFIDENTIALITY: You and/or your care partner have signed paperwork which will be entered into your electronic medical record.  These signatures attest to the fact that that the information above on your After Visit Summary has been reviewed and is understood.  Full responsibility of the confidentiality of this discharge information lies with you and/or your care-partner.

## 2024-03-22 NOTE — Progress Notes (Signed)
 See office note dated 03/01/2024 for details and current H&P  Patient presents for upper endoscopy to evaluate epigastric pain and nausea.  History of GERD. Patient was started on pantoprazole  trial 40 mg at office visit on 03/01/2024  She remains appropriate for upper endoscopy in the [facility] today.

## 2024-03-22 NOTE — Progress Notes (Signed)
1430 Robinul 0.1 mg IV given due large amount of secretions upon assessment.  MD made aware, vss  

## 2024-03-22 NOTE — Progress Notes (Signed)
 Called to room to assist during endoscopic procedure.  Patient ID and intended procedure confirmed with present staff. Received instructions for my participation in the procedure from the performing physician.

## 2024-03-22 NOTE — Op Note (Signed)
 [facility] Patient Name: Kimberly Vargas Procedure Date: 03/22/2024 2:22 PM MRN: [id number] Endoscopist: Gordy CHRISTELLA Starch , MD, [id number] Age: 53 Referring MD:  Date of Birth: [birth year] Gender: Female Account #: [id number] Procedure:                Upper GI endoscopy Indications:              Epigastric abdominal pain, Nausea, no improvement x                            3 weeks of pantoprazole  40 mg daily Medicines:                Monitored Anesthesia Care Procedure:                Pre-Anesthesia Assessment:                           - Prior to the procedure, a History and Physical                            was performed, and patient medications and                            allergies were reviewed. The patient's tolerance of                            previous anesthesia was also reviewed. The risks                            and benefits of the procedure and the sedation                            options and risks were discussed with the patient.                            All questions were answered, and informed consent                            was obtained. Prior Anticoagulants: The patient has                            taken no anticoagulant or antiplatelet agents. ASA                            Grade Assessment: II - A patient with mild systemic                            disease. After reviewing the risks and benefits,                            the patient was deemed in satisfactory condition to                            undergo the procedure.  After obtaining informed consent, the endoscope was                            passed under direct vision. Throughout the                            procedure, the patient's blood pressure, pulse, and                            oxygen saturations were monitored continuously. The                            Olympus Scope [id number] was introduced through the                            mouth, and  advanced to the second part of duodenum.                            The upper GI endoscopy was accomplished without                            difficulty. The patient tolerated the procedure                            well. Scope In: Scope Out: Findings:                 The examined esophagus was normal.                           Patchy mildly erythematous mucosa without bleeding                            was found in the gastric fundus and in the gastric                            body. Biopsies were taken with a cold forceps for                            Helicobacter pylori testing.                           The examined duodenum was normal. Complications:            No immediate complications. Estimated Blood Loss:     Estimated blood loss: none. Impression:               - Normal esophagus.                           - Erythematous mucosa in the gastric fundus and                            gastric body. Biopsied.                           -  Normal examined duodenum. Recommendation:           - Patient has a contact number available for                            emergencies. The signs and symptoms of potential                            delayed complications were discussed with the                            patient. Return to normal activities tomorrow.                            Written discharge instructions were provided to the                            patient.                           - Resume previous diet.                           - Continue present medications. Can stop                            pantoprazole  40 mg daily after 4 weeks if no                            improvement in symptoms.                           - Await pathology results.                           - CBC, CMP, complete abdominal US . Gordy CHRISTELLA Starch, MD 03/22/2024 2:43:43 PM This report has been signed electronically.

## 2024-03-22 NOTE — Progress Notes (Signed)
 Report given to PACU, vss

## 2024-03-23 ENCOUNTER — Telehealth: Payer: Self-pay | Admitting: *Deleted

## 2024-03-23 ENCOUNTER — Other Ambulatory Visit: Payer: Self-pay

## 2024-03-23 ENCOUNTER — Telehealth: Payer: Self-pay

## 2024-03-23 DIAGNOSIS — R1013 Epigastric pain: Secondary | ICD-10-CM

## 2024-03-23 DIAGNOSIS — R110 Nausea: Secondary | ICD-10-CM

## 2024-03-23 NOTE — Telephone Encounter (Signed)
 Dr. Albertus recommends patient have repeat CBC and CMP, along with a complete ultrasound.   Left message for patient to return my call.

## 2024-03-23 NOTE — Telephone Encounter (Signed)
  Follow up Call-     03/22/2024    1:52 PM  Call back number  Post procedure Call Back phone  # [phone]  Permission to leave phone message Yes    Post procedure follow up phone call. No answer at number given.  Left message on voicemail.

## 2024-03-24 NOTE — Telephone Encounter (Signed)
 Patient returning call stating she's doing just fine  Please advise  Thank you

## 2024-03-25 LAB — SURGICAL PATHOLOGY

## 2024-03-26 ENCOUNTER — Ambulatory Visit (HOSPITAL_COMMUNITY): Admission: RE | Admit: 2024-03-26 | Discharge: 2024-03-26 | Attending: Internal Medicine | Admitting: Internal Medicine

## 2024-03-26 ENCOUNTER — Ambulatory Visit: Payer: Self-pay | Admitting: Internal Medicine

## 2024-03-26 DIAGNOSIS — R1013 Epigastric pain: Secondary | ICD-10-CM | POA: Diagnosis not present

## 2024-03-26 DIAGNOSIS — R110 Nausea: Secondary | ICD-10-CM

## 2024-03-26 DIAGNOSIS — R109 Unspecified abdominal pain: Secondary | ICD-10-CM | POA: Diagnosis not present

## 2024-03-29 ENCOUNTER — Other Ambulatory Visit (INDEPENDENT_AMBULATORY_CARE_PROVIDER_SITE_OTHER)

## 2024-03-29 DIAGNOSIS — R1013 Epigastric pain: Secondary | ICD-10-CM | POA: Diagnosis not present

## 2024-03-29 DIAGNOSIS — R110 Nausea: Secondary | ICD-10-CM

## 2024-03-29 LAB — COMPREHENSIVE METABOLIC PANEL WITH GFR
ALT: 33 U/L (ref 0–35)
AST: 27 U/L (ref 0–37)
Albumin: 4.5 g/dL (ref 3.5–5.2)
Alkaline Phosphatase: 46 U/L (ref 39–117)
BUN: 14 mg/dL (ref 6–23)
CO2: 29 meq/L (ref 19–32)
Calcium: 9.6 mg/dL (ref 8.4–10.5)
Chloride: 107 meq/L (ref 96–112)
Creatinine, Ser: 1 mg/dL (ref 0.40–1.20)
GFR: 64.19 mL/min (ref >60.00)
Glucose, Bld: 101 mg/dL — ABNORMAL HIGH (ref 70–99)
Potassium: 4.5 meq/L (ref 3.5–5.1)
Sodium: 144 meq/L (ref 135–145)
Total Bilirubin: 0.8 mg/dL (ref 0.2–1.2)
Total Protein: 7.3 g/dL (ref 6.0–8.3)

## 2024-03-29 LAB — CBC WITH DIFFERENTIAL/PLATELET
Basophils Absolute: 0 K/uL (ref 0.0–0.1)
Basophils Relative: 1.1 % (ref 0.0–3.0)
Eosinophils Absolute: 0.1 K/uL (ref 0.0–0.7)
Eosinophils Relative: 3.6 % (ref 0.0–5.0)
HCT: 41.3 % (ref 36.0–46.0)
Hemoglobin: 13.5 g/dL (ref 12.0–15.0)
Lymphocytes Relative: 27.1 % (ref 12.0–46.0)
Lymphs Abs: 0.9 K/uL (ref 0.7–4.0)
MCHC: 32.8 g/dL (ref 30.0–36.0)
MCV: 93.6 fl (ref 78.0–100.0)
Monocytes Absolute: 0.3 K/uL (ref 0.1–1.0)
Monocytes Relative: 8.7 % (ref 3.0–12.0)
Neutro Abs: 1.9 K/uL (ref 1.4–7.7)
Neutrophils Relative %: 59.5 % (ref 43.0–77.0)
Platelets: 255 K/uL (ref 150.0–400.0)
RBC: 4.42 Mil/uL (ref 3.87–5.11)
RDW: 12.8 % (ref 11.5–15.5)
WBC: 3.2 K/uL — ABNORMAL LOW (ref 4.0–10.5)

## 2024-03-30 ENCOUNTER — Ambulatory Visit: Payer: Self-pay | Admitting: Internal Medicine

## 2024-03-30 DIAGNOSIS — R110 Nausea: Secondary | ICD-10-CM

## 2024-03-30 DIAGNOSIS — R1013 Epigastric pain: Secondary | ICD-10-CM

## 2024-04-08 ENCOUNTER — Ambulatory Visit (HOSPITAL_COMMUNITY): Admission: RE | Admit: 2024-04-08 | Discharge: 2024-04-08 | Attending: Internal Medicine | Admitting: Internal Medicine

## 2024-04-08 DIAGNOSIS — R110 Nausea: Secondary | ICD-10-CM | POA: Diagnosis not present

## 2024-04-08 DIAGNOSIS — R1013 Epigastric pain: Secondary | ICD-10-CM | POA: Diagnosis not present

## 2024-04-08 MED ORDER — IOHEXOL 300 MG/ML  SOLN
100.0000 mL | Freq: Once | INTRAMUSCULAR | Status: AC | PRN
  Administered 2024-04-08: 14:00:00 100 mL via INTRAVENOUS

## 2024-04-26 ENCOUNTER — Ambulatory Visit: Payer: Self-pay | Admitting: Internal Medicine
